# Patient Record
Sex: Male | Born: 1995 | Race: White | Hispanic: No | Smoking: Never smoker
Health system: Southern US, Community
[De-identification: ages and names within clinical notes are randomized; demographics above are authoritative.]

## PROBLEM LIST (undated history)

## (undated) DIAGNOSIS — Q600 Renal agenesis, unilateral: Secondary | ICD-10-CM

## (undated) DIAGNOSIS — K760 Fatty (change of) liver, not elsewhere classified: Secondary | ICD-10-CM

## (undated) HISTORY — DX: Fatty (change of) liver, not elsewhere classified: K76.0

## (undated) HISTORY — PX: OTHER SURGICAL HISTORY: SHX169

---
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
## 2017-07-05 ENCOUNTER — Emergency Department
Admission: EM | Admit: 2017-07-05 | Discharge: 2017-07-05 | Disposition: A | Discharge status: Home / Self Care | Payer: Managed Care, Other (non HMO) | Attending: Emergency Medicine | Admitting: Emergency Medicine

## 2017-07-05 ENCOUNTER — Encounter: Payer: Self-pay | Admitting: Emergency Medicine

## 2017-07-05 DIAGNOSIS — Y9269 Other specified industrial and construction area as the place of occurrence of the external cause: Secondary | ICD-10-CM | POA: Diagnosis not present

## 2017-07-05 DIAGNOSIS — S39011A Strain of muscle, fascia and tendon of abdomen, initial encounter: Secondary | ICD-10-CM | POA: Insufficient documentation

## 2017-07-05 DIAGNOSIS — X500XXA Overexertion from strenuous movement or load, initial encounter: Secondary | ICD-10-CM | POA: Diagnosis not present

## 2017-07-05 DIAGNOSIS — Y9389 Activity, other specified: Secondary | ICD-10-CM | POA: Insufficient documentation

## 2017-07-05 DIAGNOSIS — Y990 Civilian activity done for income or pay: Secondary | ICD-10-CM | POA: Diagnosis not present

## 2017-07-05 DIAGNOSIS — T148XXA Other injury of unspecified body region, initial encounter: Secondary | ICD-10-CM

## 2017-07-05 DIAGNOSIS — R109 Unspecified abdominal pain: Secondary | ICD-10-CM | POA: Insufficient documentation

## 2017-07-05 DIAGNOSIS — R1011 Right upper quadrant pain: Secondary | ICD-10-CM | POA: Diagnosis present

## 2017-07-05 LAB — URINALYSIS, COMPLETE (UACMP) WITH MICROSCOPIC
Bacteria, UA: NONE SEEN
Bilirubin Urine: NEGATIVE
Glucose, UA: NEGATIVE mg/dL
Hgb urine dipstick: NEGATIVE
Ketones, ur: NEGATIVE mg/dL
Leukocytes, UA: NEGATIVE
Nitrite: NEGATIVE
Protein, ur: NEGATIVE mg/dL
Specific Gravity, Urine: 1.021 (ref 1.005–1.030)
Squamous Epithelial / LPF: NONE SEEN
pH: 6 (ref 5.0–8.0)

## 2017-07-05 LAB — BASIC METABOLIC PANEL
Anion gap: 9 (ref 5–15)
BUN: 16 mg/dL (ref 6–20)
CO2: 24 mmol/L (ref 22–32)
Calcium: 9.1 mg/dL (ref 8.9–10.3)
Chloride: 106 mmol/L (ref 101–111)
Creatinine, Ser: 1.08 mg/dL (ref 0.61–1.24)
GFR calc Af Amer: >60 mL/min (ref >60)
GFR calc non Af Amer: >60 mL/min (ref >60)
Glucose, Bld: 151 mg/dL — ABNORMAL HIGH (ref 65–99)
Potassium: 3.8 mmol/L (ref 3.5–5.1)
Sodium: 139 mmol/L (ref 135–145)

## 2017-07-05 LAB — CBC
HCT: 45.2 % (ref 40.0–52.0)
Hemoglobin: 15.8 g/dL (ref 13.0–18.0)
MCH: 32.2 pg (ref 26.0–34.0)
MCHC: 34.9 g/dL (ref 32.0–36.0)
MCV: 92.2 fL (ref 80.0–100.0)
Platelets: 280 10*3/uL (ref 150–440)
RBC: 4.91 MIL/uL (ref 4.40–5.90)
RDW: 13 % (ref 11.5–14.5)
WBC: 9.3 10*3/uL (ref 3.8–10.6)

## 2017-07-05 MED ORDER — ONDANSETRON 4 MG PO TBDP
4.0000 mg | ORAL_TABLET | Freq: Once | ORAL | Status: AC
  Administered 2017-07-05: 4 mg via ORAL
  Filled 2017-07-05: qty 1

## 2017-07-05 MED ORDER — CARISOPRODOL 350 MG PO TABS: 350.0000 mg | ORAL_TABLET | Freq: Three times a day (TID) | ORAL | 0 refills | Status: DC | PRN

## 2017-07-05 MED ORDER — DICLOFENAC SODIUM 3 % TD GEL: 1.0000 "application " | Freq: Two times a day (BID) | TRANSDERMAL | 0 refills | Status: DC | PRN

## 2017-07-05 NOTE — ED Triage Notes (Signed)
Pt reports right sided flank pain x 3 or 4 days. Pt denies hx of kidney stones. Pt is ambulatory to triage in NAD.

## 2017-07-05 NOTE — ED Provider Notes (Addendum)
[facility] Emergency Department Provider Note  ____________________________________________   First MD Initiated Contact with Patient 07/05/17 0211     (approximate)  I have reviewed the triage vital signs and the nursing notes.   HISTORY  Chief Complaint Flank Pain   HPI Victor Fowler is a 21 y.o. male without any chronic medical conditions who is presenting with right upper quadrant pain radiating to his right flank over the past 2 days.  He says the pain started after lifting a heavy tire at work.  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  He says that he had to lift a large heavy tires and hold it outward for sustained period of time.  He says that ever since then he has been having pain which feels like a muscle tightness to his right upper quadrant and radiating to his right flank.  He denies any difficulty urinating.  Denies any nausea or vomiting.  Has tried ibuprofen without relief.  Denies radiation of the pain to the back to the shoulder.   History reviewed. No pertinent past medical history.  There are no active problems to display for this patient.   History reviewed. No pertinent surgical history.  Prior to Admission medications   Not on File    Allergies Patient has no known allergies.  No family history on file.  Social History Social History  Substance Use Topics  . Smoking status: Never Smoker  . Smokeless tobacco: Never Used  . Alcohol use No    Review of Systems  Constitutional: No fever/chills Eyes: No visual changes. ENT: No sore throat. Cardiovascular: Denies chest pain. Respiratory: Denies shortness of breath. Gastrointestinal:   No nausea, no vomiting.  No diarrhea.  No constipation. Genitourinary: Negative for dysuria. Musculoskeletal: Negative for back pain. Skin: Negative for rash. Neurological: Negative for headaches, focal weakness or  numbness.   ____________________________________________   PHYSICAL EXAM:  VITAL SIGNS: ED Triage Vitals  Enc Vitals Group     BP 07/05/17 0035 (!) 144/104     Pulse Rate 07/05/17 0035 100     Resp 07/05/17 0035 18     Temp 07/05/17 0035 98.2 F (36.8 C)     Temp Source 07/05/17 0035 Oral     SpO2 07/05/17 0035 99 %     Weight 07/05/17 0037 187 lb (84.8 kg)     Height 07/05/17 0037 5\' 8"  (1.727 m)     Head Circumference --      Peak Flow --      Pain Score 07/05/17 0035 7     Pain Loc --      Pain Edu? --      Excl. in GC? --     Constitutional: Alert and oriented. Well appearing and in no acute distress. Eyes: Conjunctivae are normal.  Head: Atraumatic. Nose: No congestion/rhinnorhea. Mouth/Throat: Mucous membranes are moist.  Neck: No stridor.   Cardiovascular: Normal rate, regular rhythm. Grossly normal heart sounds.   Respiratory: Normal respiratory effort.  No retractions. Lungs CTAB. Gastrointestinal: Soft with minimal right upper quadrant tenderness with a negative Murphy sign. No distention. No CVA tenderness. Musculoskeletal: No lower extremity tenderness nor edema.  No joint effusions. Neurologic:  Normal speech and language. No gross focal neurologic deficits are appreciated. Skin:  Skin is warm, dry and intact. No rash noted.  No vesicular rash in any dermatomal patterns. Psychiatric: Mood and affect are normal. Speech and behavior are normal.  ____________________________________________  LABS (all labs ordered are listed, but only abnormal results are displayed)  Labs Reviewed  URINALYSIS, COMPLETE (UACMP) WITH MICROSCOPIC - Abnormal; Notable for the following:       Result Value   Color, Urine YELLOW (*)    APPearance CLEAR (*)    All other components within normal limits  BASIC METABOLIC PANEL - Abnormal; Notable for the following:    Glucose, Bld 151 (*)    All other components within normal limits  CBC    ____________________________________________  EKG   ____________________________________________  RADIOLOGY   ____________________________________________   PROCEDURES  Procedure(s) performed:   Procedures  Critical Care performed:   ____________________________________________   INITIAL IMPRESSION / ASSESSMENT AND PLAN / ED COURSE  Pertinent labs & imaging results that were available during my care of the patient were reviewed by me and considered in my medical decision making (see chart for details).  Differential diagnosis includes, but is not limited to, biliary disease (biliary colic, acute cholecystitis, cholangitis, choledocholithiasis, etc), intrathoracic causes for epigastric abdominal pain including ACS, gastritis, duodenitis, pancreatitis, small bowel or large bowel obstruction, abdominal aortic aneurysm, hernia, and gastritis.      Patient with history and physical consistent with musculoskeletal pain.  Also very reassuring lab work.  Liver labs as well as lipase were not sent initially.  I discussed further workup with the patient including adding on liver labs as well as an ultrasound.  However, he agrees that the symptoms are mostly musculoskeletal.  He has had no difficulty with eating but such as pain after eating recently.  He is young and not obese and is otherwise not the typical demographic for gallbladder disease.  Most likely a musculoskeletal issue and also likely exacerbated by the patient continuing to work in left.  He will be given a work note.  He will be given a prescription for Voltaren gel as well as Soma.  He understands to return immediately for any worsening or concerning symptoms as we did discuss that we did not completely rule out gallbladder and liver problems. ____________________________________________   FINAL CLINICAL IMPRESSION(S) / ED DIAGNOSES  Abdominal pain.  Likely abdominal wall pain.    NEW MEDICATIONS STARTED DURING  THIS VISIT:  New Prescriptions   No medications on file     Note:  This document was prepared using Dragon voice recognition software and may include unintentional dictation errors.     Myrna Blazer, MD 07/05/17 0330    Myrna Blazer, MD 07/05/17 0330

## 2017-07-05 NOTE — ED Notes (Signed)

## 2017-07-14 ENCOUNTER — Telehealth: Payer: Self-pay | Admitting: Family Medicine

## 2017-07-14 NOTE — Telephone Encounter (Signed)
[facility] for right side pain.  I have scheduled a hospital follow up appt/MW

## 2017-07-16 NOTE — Progress Notes (Signed)
Patient: Victor ShoemakerBradley P Fowler Male    DOB: March 13, 1996   21 y.o.   MRN: [id number] Visit Date: 07/17/2017  Today's Provider: Mila Merryonald Rayven Rettig, MD   Chief Complaint  Patient presents with  . Hospitalization Follow-up   Subjective:    HPI     Follow up ER visit  Patient was seen in ER for RUQ abdominal pian and muscle strain on 07/05/2017. He was treated for RUQ abdominal pain and muscle strain. Treatment for this included; given rx for Voltaren gel as well as Soma. Advised if pain returns, he should be evaluated for gallbladder and liver problems. He reports good compliance with treatment. He reports this condition is Improved.  ----------------------------------------------------------------   Patient states symptoms have improved since he made some diet changes. Patient stated [facility]. US of abdomin was done-showing fatty liver. Patient was advised to change his diet-cut back on fried foods. He states that pain has mostly resolved since making dietary changes.   No Known Allergies   Current Outpatient Medications:  .  carisoprodol (SOMA) 350 MG tablet, Take 1 tablet (350 mg total) by mouth 3 (three) times daily as needed. (Patient not taking: Reported on 07/17/2017), Disp: 15 tablet, Rfl: 0 .  Diclofenac Sodium 3 % GEL, Place 1 application onto the skin every 12 (twelve) hours as needed. (Patient not taking: Reported on 07/17/2017), Disp: 50 g, Rfl: 0  Review of Systems  Constitutional: Negative for appetite change, chills and fever.  Respiratory: Negative for chest tightness, shortness of breath and wheezing.   Cardiovascular: Negative for chest pain and palpitations.  Gastrointestinal: Negative for abdominal pain, nausea and vomiting.    Social History   Tobacco Use  . Smoking status: Never Smoker  . Smokeless tobacco: Never Used  Substance Use Topics  . Alcohol use: No   Objective:   BP 138/84 (BP Location:  Right Arm, Patient Position: Sitting, Cuff Size: Large)   Pulse 99   Temp 98.7 F (37.1 C) (Oral)   Resp 16   Ht 5\' 8"  (1.727 m)   Wt 181 lb (82.1 kg)   SpO2 98%   BMI 27.52 kg/m  Vitals:   07/17/17 1126  BP: 138/84  Pulse: 99  Resp: 16  Temp: 98.7 F (37.1 C)  TempSrc: Oral  SpO2: 98%  Weight: 181 lb (82.1 kg)  Height: 5\' 8"  (1.727 m)   Dep[facility] 07/17/2017  Decreased Interest 0  Down, Depressed, Hopeless 0  PHQ - 2 Score 0  Altered sleeping 3  Tired, decreased energy 0  Change in appetite 0  Feeling bad or failure about yourself  0  Trouble concentrating 0  Moving slowly or fidgety/restless 0  Suicidal thoughts 0  PHQ-9 Score 3  Difficult doing work/chores Not difficult at all     Physical Exam  General Appearance:    Alert, cooperative, no distress  Eyes:    PERRL, conjunctiva/corneas clear, EOM's intact       Lungs:     Clear to auscultation bilaterally, respirations unlabored  Heart:    Regular rate and rhythm  Abdomen:   bowel sounds present and normal in all 4 quadrants, soft, round, nontender or nondistended. No CVA tenderness        Assessment & Plan:     1. Fatty liver Has started low fat diet and symptoms resolved. Will [facility].  - Hepatitis  A vaccine adult IM  2. Need for influenza vaccination  - Flu Vaccine QUAD 36+ mos IM       Mila Merryonald Alex Mcmanigal, MD  [facility]

## 2017-07-17 ENCOUNTER — Encounter: Payer: Self-pay | Admitting: Family Medicine

## 2017-07-17 ENCOUNTER — Ambulatory Visit: Payer: Managed Care, Other (non HMO) | Admitting: Family Medicine

## 2017-07-17 VITALS — BP 138/84 | HR 99 | Temp 98.7°F | Resp 16 | Ht 68.0 in | Wt 181.0 lb

## 2017-07-17 DIAGNOSIS — K760 Fatty (change of) liver, not elsewhere classified: Secondary | ICD-10-CM | POA: Diagnosis not present

## 2017-07-17 DIAGNOSIS — Z23 Encounter for immunization: Secondary | ICD-10-CM | POA: Diagnosis not present

## 2017-07-22 DIAGNOSIS — K760 Fatty (change of) liver, not elsewhere classified: Secondary | ICD-10-CM | POA: Insufficient documentation

## 2017-07-29 ENCOUNTER — Encounter: Payer: Self-pay | Admitting: Family Medicine

## 2017-07-29 ENCOUNTER — Ambulatory Visit: Payer: Managed Care, Other (non HMO) | Admitting: Family Medicine

## 2017-07-29 VITALS — BP 110/70 | HR 87 | Temp 98.3°F | Resp 16 | Wt 181.0 lb

## 2017-07-29 DIAGNOSIS — R42 Dizziness and giddiness: Secondary | ICD-10-CM

## 2017-07-29 DIAGNOSIS — R5383 Other fatigue: Secondary | ICD-10-CM

## 2017-07-29 DIAGNOSIS — R739 Hyperglycemia, unspecified: Secondary | ICD-10-CM | POA: Diagnosis not present

## 2017-07-29 NOTE — Patient Instructions (Signed)
Hypoglycemia  Hypoglycemia occurs when the level of sugar (glucose) in the blood is too low. Glucose is a type of sugar that provides the body's main source of energy. Certain hormones (insulin and glucagon) control the level of glucose in the blood. Insulin lowers blood glucose, and glucagon increases blood glucose. Hypoglycemia can result from having too much insulin in the bloodstream, or from not eating enough food that contains glucose.  Hypoglycemia can happen in people who do or do not have diabetes. It can develop quickly, and it can be a medical emergency.  What are the causes?  Hypoglycemia occurs most often in people who have diabetes. If you have diabetes, hypoglycemia may be caused by:   Diabetes medicine.   Not eating enough, or not eating often enough.   Increased physical activity.   Drinking alcohol, especially when you have not eaten recently.    If you do not have diabetes, hypoglycemia may be caused by:   A tumor in the pancreas. The pancreas is the organ that makes insulin.   Not eating enough, or not eating for long periods at a time (fasting).   Severe infection or illness that affects the liver, heart, or kidneys.   Certain medicines.    You may also have reactive hypoglycemia. This condition causes hypoglycemia within 4 hours of eating a meal. This may occur after having stomach surgery. Sometimes, the cause of reactive hypoglycemia is not known.  What increases the risk?  Hypoglycemia is more likely to develop in:   People who have diabetes and take medicines to lower blood glucose.   People who abuse alcohol.   People who have a severe illness.    What are the signs or symptoms?  Hypoglycemia may not cause any symptoms. If you have symptoms, they may include:   Hunger.   Anxiety.   Sweating and feeling clammy.   Confusion.   Dizziness or feeling light-headed.   Sleepiness.   Nausea.   Increased heart rate.   Headache.   Blurry  vision.   Seizure.   Nightmares.   Tingling or numbness around the mouth, lips, or tongue.   A change in speech.   Decreased ability to concentrate.   A change in coordination.   Restless sleep.   Tremors or shakes.   Fainting.   Irritability.    How is this diagnosed?  Hypoglycemia is diagnosed with a blood test to measure your blood glucose level. This blood test is done while you are having symptoms. Your health care provider may also do a physical exam and review your medical history.  If you do not have diabetes, other tests may be done to find the cause of your hypoglycemia.  How is this treated?  This condition can often be treated by immediately eating or drinking something that contains glucose, such as:   3-4 sugar tablets (glucose pills).   Glucose gel, 15-gram tube.   Fruit juice, 4 oz (120 mL).   Regular soda (not diet soda), 4 oz (120 mL).   Low-fat milk, 4 oz (120 mL).   Several pieces of hard candy.   Sugar or honey, 1 Tbsp.    Treating Hypoglycemia If You Have Diabetes    If you are alert and able to swallow safely, follow the 15:15 rule:   Take 15 grams of a rapid-acting carbohydrate. Rapid-acting options include:  ? 1 tube of glucose gel.  ? 3 glucose pills.  ? 6-8 pieces of hard candy.  ?   4 oz (120 mL) of fruit juice.  ? 4 oz (120 ml) of regular (not diet) soda.   Check your blood glucose 15 minutes after you take the carbohydrate.   If the repeat blood glucose level is still at or below 70 mg/dL (3.9 mmol/L), take 15 grams of a carbohydrate again.   If your blood glucose level does not increase above 70 mg/dL (3.9 mmol/L) after 3 tries, seek emergency medical care.   After your blood glucose level returns to normal, eat a meal or a snack within 1 hour.    Treating Severe Hypoglycemia  Severe hypoglycemia is when your blood glucose level is at or below 54 mg/dL (3 mmol/L). Severe hypoglycemia is an emergency. Do not wait to see if the symptoms will go away. Get medical help  right away. Call your local emergency services (911 in the [place] Do not drive yourself to the hospital.  If you have severe hypoglycemia and you cannot eat or drink, you may need an injection of glucagon. A family member or close friend should learn how to check your blood glucose and how to give you a glucagon injection. Ask your health care provider if you need to have an emergency glucagon injection kit available.  Severe hypoglycemia may need to be treated in a hospital. The treatment may include getting glucose through an IV tube. You may also need treatment for the cause of your hypoglycemia.  Follow these instructions at home:  General instructions   Avoid any diets that cause you to not eat enough food. Talk with your health care provider before you start any new diet.   Take over-the-counter and prescription medicines only as told by your health care provider.   Limit alcohol intake to no more than 1 drink per day for nonpregnant women and 2 drinks per day for men. One drink equals 12 oz of beer, 5 oz of wine, or 1 oz of hard liquor.   Keep all follow-up visits as told by your health care provider. This is important.  If You Have Diabetes:     Make sure you know the symptoms of hypoglycemia.   Always have a rapid-acting carbohydrate snack with you to treat low blood sugar.   Follow your diabetes management plan, as told by your health care provider. Make sure you:  ? Take your medicines as directed.  ? Follow your exercise plan.  ? Follow your meal plan. Eat on time, and do not skip meals.  ? Check your blood glucose as often as directed. Make sure to check your blood glucose before and after exercise. If you exercise longer or in a different way than usual, check your blood glucose more often.  ? Follow your sick day plan whenever you cannot eat or drink normally. Make this plan in advance with your health care provider.   Share your diabetes management plan with people in your workplace, school,  and household.   Check your urine for ketones when you are ill and as told by your health care provider.   Carry a medical alert card or wear medical alert jewelry.  If You Have Reactive Hypoglycemia or Low Blood Sugar From Other Causes:   Monitor your blood glucose as told by your health care provider.   Follow instructions from your health care provider about eating or drinking restrictions.  Contact a health care provider if:   You have problems keeping your blood glucose in your target range.   You have   frequent episodes of hypoglycemia.  Get help right away if:   You continue to have hypoglycemia symptoms after eating or drinking something containing glucose.   Your blood glucose is at or below 54 mg/dL (3 mmol/L).   You have a seizure.   You faint.  These symptoms may represent a serious problem that is an emergency. Do not wait to see if the symptoms will go away. Get medical help right away. Call your local emergency services (911 in the [place] Do not drive yourself to the hospital.  This information is not intended to replace advice given to you by your health care provider. Make sure you discuss any questions you have with your health care provider.  Document Released: 08/26/2005 Document Revised: 02/07/2016 Document Reviewed: 09/29/2015  Elsevier Interactive Patient Education  2018 [employer]

## 2017-07-29 NOTE — Progress Notes (Signed)
Patient: Victor Fowler Male    DOB: [birth year]   21 y.o.   MRN: [id number] Visit Date: 07/29/2017  Today's Provider: Mila Merryonald Montez Stryker, MD   Chief Complaint  Patient presents with  . Dizziness  . Nausea   Subjective:    Patient states for the last week he has been having dizziness and weakness everyday right before lunch. Patient also states he has been feeling very fatigued lately. Patient states that he has been eating a piece of candy when he starts to feel the dizziness and he sometimes feel better. Patient states that at today he has also been feeling nauseous.    Dizziness  This is a new problem. The current episode started in the past 7 days. The problem occurs daily. The problem has been unchanged. Associated symptoms include fatigue, nausea and weakness. Pertinent negatives include no abdominal pain, anorexia, arthralgias, change in bowel habit, chest pain, chills, congestion, coughing, diaphoresis, fever, headaches, joint swelling, myalgias, neck pain, numbness, rash, sore throat, swollen glands, urinary symptoms, vertigo, visual change or vomiting. Nothing aggravates the symptoms.   He is noted to have had mildly elevated blood sugar at recent ER visits.  Glucose  Date Value Ref Range Status  11/26/2012 122 (H) 65 - 99 mg/dL Final   Glucose, Bld  Date Value Ref Range Status  07/05/2017 151 (H) 65 - 99 mg/dL Final       No Known Allergies  No current outpatient medications on file.  Review of Systems  Constitutional: Positive for fatigue. Negative for appetite change, chills, diaphoresis and fever.  HENT: Negative for congestion and sore throat.   Respiratory: Negative for cough, chest tightness, shortness of breath and wheezing.   Cardiovascular: Negative for chest pain and palpitations.  Gastrointestinal: Positive for nausea. Negative for abdominal pain, anorexia, change in bowel habit and vomiting.  Musculoskeletal: Negative for arthralgias, joint  swelling, myalgias and neck pain.  Skin: Negative for rash.  Neurological: Positive for dizziness, weakness and light-headedness. Negative for vertigo, numbness and headaches.    Social History   Tobacco Use  . Smoking status: Never Smoker  . Smokeless tobacco: Never Used  Substance Use Topics  . Alcohol use: No   Objective:   BP 110/70 (BP Location: Right Arm, Patient Position: Sitting, Cuff Size: Large)   Pulse 87   Temp 98.3 F (36.8 C) (Oral)   Resp 16   Wt 181 lb (82.1 kg)   SpO2 97%   BMI 27.52 kg/m  Vitals:   07/29/17 1035  BP: 110/70  Pulse: 87  Resp: 16  Temp: 98.3 F (36.8 C)  TempSrc: Oral  SpO2: 97%  Weight: 181 lb (82.1 kg)     Physical Exam   General Appearance:    Alert, cooperative, no distress  Eyes:    PERRL, conjunctiva/corneas clear, EOM's intact       Lungs:     Clear to auscultation bilaterally, respirations unlabored  Heart:    Regular rate and rhythm  Neurologic:   Awake, alert, oriented x 3. No apparent focal neurological           defect.           Assessment & Plan:     1. Other fatigue  - Hemoglobin A1c - COMPLETE METABOLIC PANEL WITH GFR - TSH  2. Dizziness  - Hemoglobin A1c - COMPLETE METABOLIC PANEL WITH GFR - TSH  3. Hyperglycemia  - Hemoglobin A1c - COMPLETE METABOLIC PANEL WITH  GFR - TSH  Symptoms suspicious for hypoglycemic episodes, he reports family history of diabetes which needs to be ruled out. Mila Merry.       Ludivina Guymon, MD  [facility]

## 2017-07-30 ENCOUNTER — Ambulatory Visit: Payer: Managed Care, Other (non HMO) | Admitting: Family Medicine

## 2017-07-30 LAB — COMPLETE METABOLIC PANEL WITH GFR
AG Ratio: 1.9 (calc) (ref 1.0–2.5)
ALT: 25 U/L (ref 9–46)
AST: 18 U/L (ref 10–40)
Albumin: 4.8 g/dL (ref 3.6–5.1)
Alkaline phosphatase (APISO): 82 U/L (ref 40–115)
BUN: 11 mg/dL (ref 7–25)
CO2: 25 mmol/L (ref 20–32)
Calcium: 10.1 mg/dL (ref 8.6–10.3)
Chloride: 103 mmol/L (ref 98–110)
Creat: 0.9 mg/dL (ref 0.60–1.35)
GFR, Est African American: 140 mL/min/{1.73_m2} (ref >=60)
GFR, Est Non African American: 121 mL/min/{1.73_m2} (ref >=60)
Globulin: 2.5 g/dL (calc) (ref 1.9–3.7)
Glucose, Bld: 101 mg/dL — ABNORMAL HIGH (ref 65–99)
Potassium: 4.2 mmol/L (ref 3.5–5.3)
Sodium: 137 mmol/L (ref 135–146)
Total Bilirubin: 0.6 mg/dL (ref 0.2–1.2)
Total Protein: 7.3 g/dL (ref 6.1–8.1)

## 2017-07-30 LAB — HEMOGLOBIN A1C
Hgb A1c MFr Bld: 5.3 % of total Hgb (ref <5.7)
Mean Plasma Glucose: 105 (calc)
eAG (mmol/L): 5.8 (calc)

## 2017-07-30 LAB — TSH: TSH: 1.62 mIU/L (ref 0.40–4.50)

## 2017-08-05 ENCOUNTER — Encounter: Payer: Self-pay | Admitting: Family Medicine

## 2017-08-05 ENCOUNTER — Ambulatory Visit: Payer: Managed Care, Other (non HMO) | Admitting: Family Medicine

## 2017-08-05 VITALS — BP 110/54 | HR 101 | Temp 98.2°F | Resp 16 | Wt 181.0 lb

## 2017-08-05 DIAGNOSIS — R112 Nausea with vomiting, unspecified: Secondary | ICD-10-CM

## 2017-08-05 DIAGNOSIS — K529 Noninfective gastroenteritis and colitis, unspecified: Secondary | ICD-10-CM

## 2017-08-05 DIAGNOSIS — R109 Unspecified abdominal pain: Secondary | ICD-10-CM | POA: Diagnosis not present

## 2017-08-05 MED ORDER — PROMETHAZINE HCL 25 MG PO TABS: 25.0000 mg | ORAL_TABLET | Freq: Three times a day (TID) | ORAL | 0 refills | Status: DC | PRN

## 2017-08-05 NOTE — Patient Instructions (Addendum)
Please go to the [facility] draw center in [address] on the second floor of [facility]    Viral Gastroenteritis, Adult Viral gastroenteritis is also known as the stomach flu. This condition is caused by various viruses. These viruses can be passed from person to person very easily (are very contagious). This condition may affect your stomach, small intestine, and large intestine. It can cause sudden watery diarrhea, fever, and vomiting. Diarrhea and vomiting can make you feel weak and cause you to become dehydrated. You may not be able to keep fluids down. Dehydration can make you tired and thirsty, cause you to have a dry mouth, and decrease how often you urinate. Older adults and people with other diseases or a weak immune system are at higher risk for dehydration. It is important to replace the fluids that you lose from diarrhea and vomiting. If you become severely dehydrated, you may need to get fluids through an IV tube. What are the causes? Gastroenteritis is caused by various viruses, including rotavirus and norovirus. Norovirus is the most common cause in adults. You can get sick by eating food, drinking water, or touching a surface contaminated with one of these viruses. You can also get sick from sharing utensils or other personal items with an infected person. What increases the risk? This condition is more likely to develop in people:  Who have a weak defense system (immune system).  Who live with one or more children who are younger than 626 years old.  Who live in a nursing home.  Who go on cruise ships.  What are the signs or symptoms? Symptoms of this condition start suddenly 1-2 days after exposure to a virus. Symptoms may last a few days or as long as a week. The most common symptoms are watery diarrhea and vomiting. Other symptoms include:  Fever.  Headache.  Fatigue.  Pain in the abdomen.  Chills.  Weakness.  Nausea.  Muscle aches.  Loss of  appetite.  How is this diagnosed? This condition is diagnosed with a medical history and physical exam. You may also have a stool test to check for viruses or other infections. How is this treated? This condition typically goes away on its own. The focus of treatment is to restore lost fluids (rehydration). Your health care provider may recommend that you take an oral rehydration solution (ORS) to replace important salts and minerals (electrolytes) in your body. Severe cases of this condition may require giving fluids through an IV tube. Treatment may also include medicine to help with your symptoms. Follow these instructions at home: Follow instructions from your health care provider about how to care for yourself at home. Eating and drinking Follow these recommendations as told by your health care provider:  Take an ORS. This is a drink that is sold at pharmacies and retail stores.  Drink clear fluids in small amounts as you are able. Clear fluids include water, ice chips, diluted fruit juice, and low-calorie sports drinks.  Eat bland, easy-to-digest foods in small amounts as you are able. These foods include bananas, applesauce, rice, lean meats, toast, and crackers.  Avoid fluids that contain a lot of sugar or caffeine, such as energy drinks, sports drinks, and soda.  Avoid alcohol.  Avoid spicy or fatty foods.  General instructions   Drink enough fluid to keep your urine clear or pale yellow.  Wash your hands often. If soap and water are not available, use hand sanitizer.  Make sure that  all people in your household wash their hands well and often.  Take over-the-counter and prescription medicines only as told by your health care provider.  Rest at home while you recover.  Watch your condition for any changes.  Take a warm bath to relieve any burning or pain from frequent diarrhea episodes.  Keep all follow-up visits as told by your health care provider. This is  important. Contact a health care provider if:  You cannot keep fluids down.  Your symptoms get worse.  You have new symptoms.  You feel light-headed or dizzy.  You have muscle cramps. Get help right away if:  You have chest pain.  You feel extremely weak or you faint.  You see blood in your vomit.  Your vomit looks like coffee grounds.  You have bloody or black stools or stools that look like tar.  You have a severe headache, a stiff neck, or both.  You have a rash.  You have severe pain, cramping, or bloating in your abdomen.  You have trouble breathing or you are breathing very quickly.  Your heart is beating very quickly.  Your skin feels cold and clammy.  You feel confused.  You have pain when you urinate.  You have signs of dehydration, such as: ? Dark urine, very little urine, or no urine. ? Cracked lips. ? Dry mouth. ? Sunken eyes. ? Sleepiness. ? Weakness. This information is not intended to replace advice given to you by your health care provider. Make sure you discuss any questions you have with your health care provider. Document Released: 08/26/2005 Document Revised: 02/07/2016 Document Reviewed: 05/02/2015 Elsevier Interactive Patient Education  2017 [employer]

## 2017-08-05 NOTE — Progress Notes (Signed)
Patient: Victor Fowler Male    DOB: Dec 01, 1995   21 y.o.   MRN: [id number] Visit Date: 08/05/2017  Today's Provider: Mila Merryonald Masayoshi Couzens, MD   Chief Complaint  Patient presents with  . Hospitalization Follow-up   Subjective:    HPI   Follow up ER visit  Patient was seen in ER for abdominal pain on 08/04/2017. He was treated for GERD. Treatment for this included; patient was given GI cocktail in ER. Patient was discharged home with rx for Famotidine 20 mg qd, for GERD. He reports good compliance with treatment. He reports this condition is Unchanged.  ----------------------------------------------------------------   He was previously seen with intermittent RUQ with work up remarkable only for fatty liver on ultrasound. He had been doing better with low fat diet until 2 days ago when he starting having RUQ pain followed by nausea, vomiting, and diarrhea yesterday which prompted ER visit.  Labs were only remarkable for elevated WBC. Symptoms apparently resolved with GI cocktail. He felt a little better last night, but still having pain, nausea, and diarrhea this morning he has not yet take famotidine. Today.    No Known Allergies   Current Outpatient Medications:  .  famotidine (PEPCID) 20 MG tablet, Take 20 mg by mouth., Disp: , Rfl:   Review of Systems  Constitutional: Negative for appetite change, chills and fever.  Respiratory: Negative for chest tightness, shortness of breath and wheezing.   Cardiovascular: Negative for chest pain and palpitations.  Gastrointestinal: Positive for abdominal pain, nausea and vomiting. Negative for diarrhea.    Social History   Tobacco Use  . Smoking status: Never Smoker  . Smokeless tobacco: Never Used  Substance Use Topics  . Alcohol use: No   Objective:   BP (!) 110/54 (BP Location: Right Arm, Patient Position: Sitting, Cuff Size: Large)   Pulse (!) 101   Temp 98.2 F (36.8 C) (Oral)   Resp 16   Wt 181 lb (82.1 kg)    SpO2 99%   BMI 27.52 kg/m  Vitals:   08/05/17 1114  BP: (!) 110/54  Pulse: (!) 101  Resp: 16  Temp: 98.2 F (36.8 C)  TempSrc: Oral  SpO2: 99%  Weight: 181 lb (82.1 kg)     Physical Exam  General Appearance:    Alert, cooperative, no distress  Eyes:    PERRL, conjunctiva/corneas clear, EOM's intact       Lungs:     Clear to auscultation bilaterally, respirations unlabored  Heart:    Regular rate and rhythm  Abdomen:   Mild diffuse abdominal tenderness, no rebound or guarding, no masses, bowel sounds hypoactive throughout.         Assessment & Plan:     1. Gastroenteritis Counseled on bland diet, increasing clear liquids, expect improvement over the next few days.   2. Nausea and vomiting, intractability of vomiting not specified, unspecified vomiting type  - promethazine (PHENERGAN) 25 MG tablet; Take 1 tablet (25 mg total) by mouth every 8 (eight) hours as needed for nausea or vomiting.  Dispense: 20 tablet; Refill: 0  3. Abdominal pain, unspecified abdominal location Unclear if this is exacerbation of pain he has been having over the last several weeks, or part of acute gastroenteritis. He did improve with GI cocktail given in ER. Will proceed with GI workup.  - H. pylori breath test - DG UGI W/SMALL BOWEL; Future       Mila Merryonald Consuella Scurlock, MD  [facility]  [facility]  

## 2017-08-12 ENCOUNTER — Ambulatory Visit
Admission: RE | Admit: 2017-08-12 | Discharge: 2017-08-12 | Disposition: A | Discharge status: Home / Self Care | Payer: Managed Care, Other (non HMO) | Source: Ambulatory Visit | Attending: Family Medicine | Admitting: Family Medicine

## 2017-08-12 ENCOUNTER — Telehealth: Payer: Self-pay

## 2017-08-12 DIAGNOSIS — R935 Abnormal findings on diagnostic imaging of other abdominal regions, including retroperitoneum: Secondary | ICD-10-CM | POA: Insufficient documentation

## 2017-08-12 DIAGNOSIS — R109 Unspecified abdominal pain: Secondary | ICD-10-CM | POA: Insufficient documentation

## 2017-08-12 LAB — H. PYLORI BREATH TEST: H. pylori Breath Test: NOT DETECTED

## 2017-08-12 IMAGING — RF DG UGI W/ SMALL BOWEL
2 series · 12 of 12 positions shown · non-contrast
Comparison: None available in PACs

CLINICAL DATA: Right-sided abdominal pain for the past month.

EXAM:
UPPER GI SERIES WITH SMALL BOWEL FOLLOW-THROUGH using barium
FLUOROSCOPY TIME:  Fluoroscopy Time:  1 minutes, 30 seconds
Radiation Exposure Index (if provided by the fluoroscopic device):
[CL] micro Gy per meters square
Number of Acquired Spot Images: 13
TECHNIQUE: Combined double contrast and single contrast upper GI series using
effervescent crystals, thick barium, and thin barium. Subsequently,
serial images of the small bowel were obtained including spot views
of the terminal ileum.
After obtaining a scout radiograph a routine upper GI series was
performed using thin and high density barium. Effervescent crystals
and a barium tablet were administered

[Series 1: t abdomen supine · 0.14mm/px · 2 of 2 slices shown]
[im 1/2]
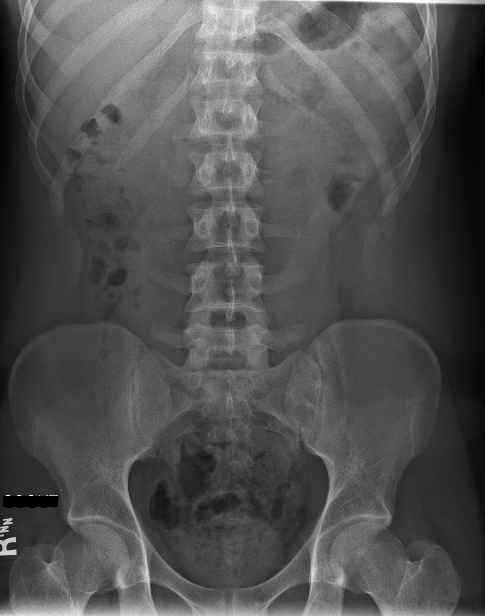
[im 2/2]
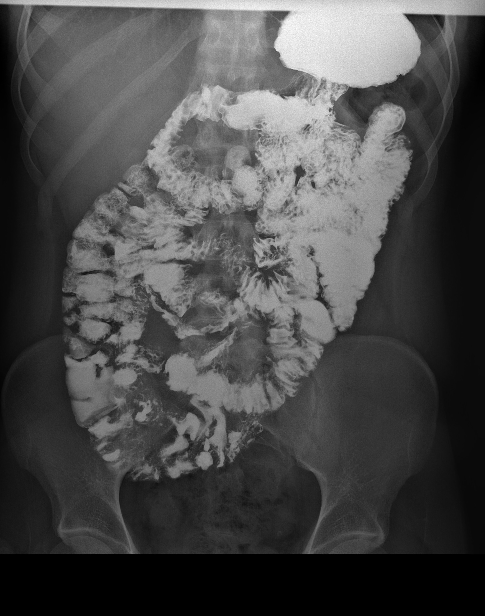

[Series 2: fluoro_barium 2fps_bw · 0.17mm/px · 10 of 10 slices shown]
[im 1/10]
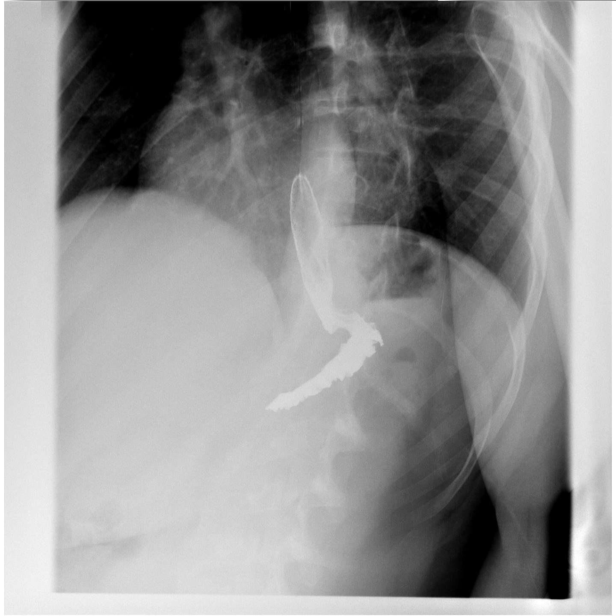
[im 2/10]
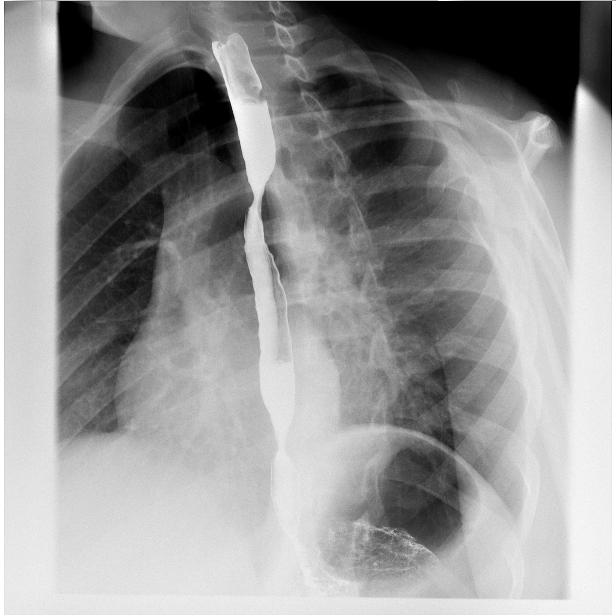
[im 3/10]
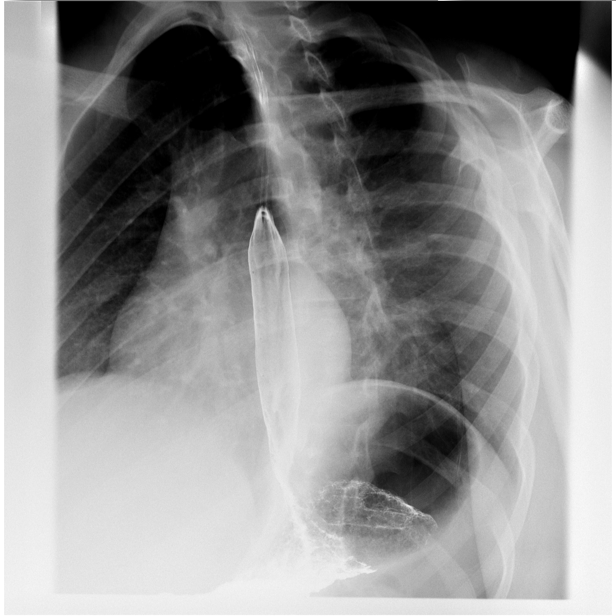
[im 4/10]
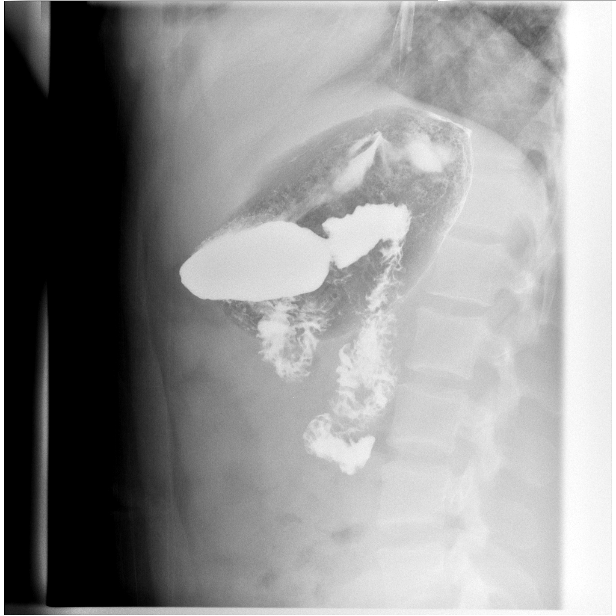
[im 5/10]
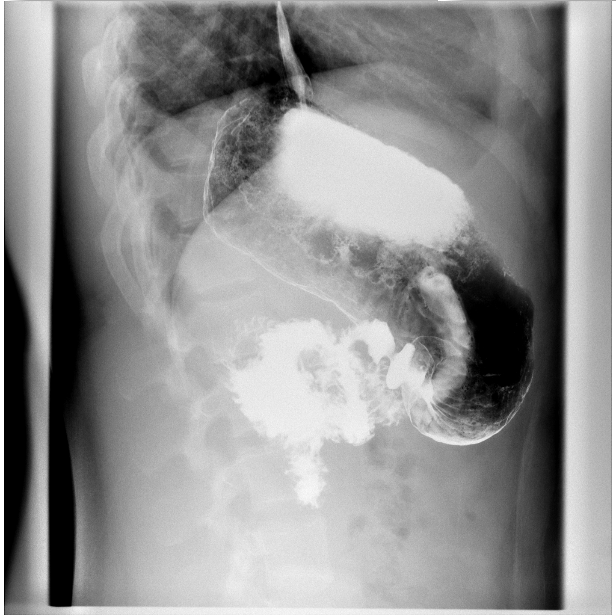
[im 6/10]
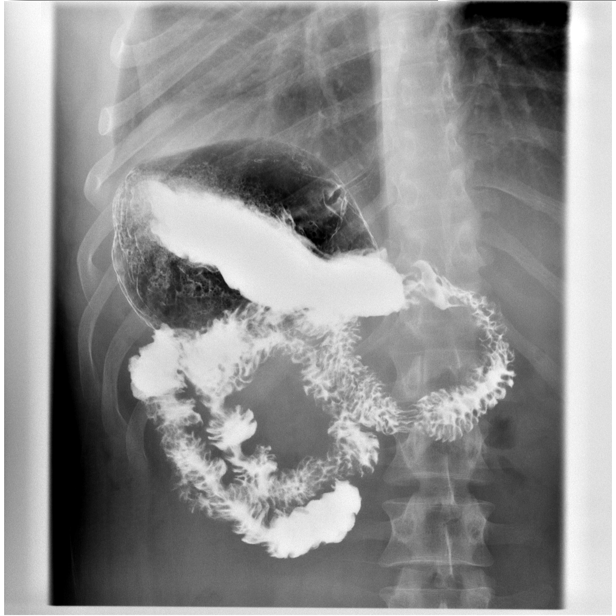
[im 7/10]
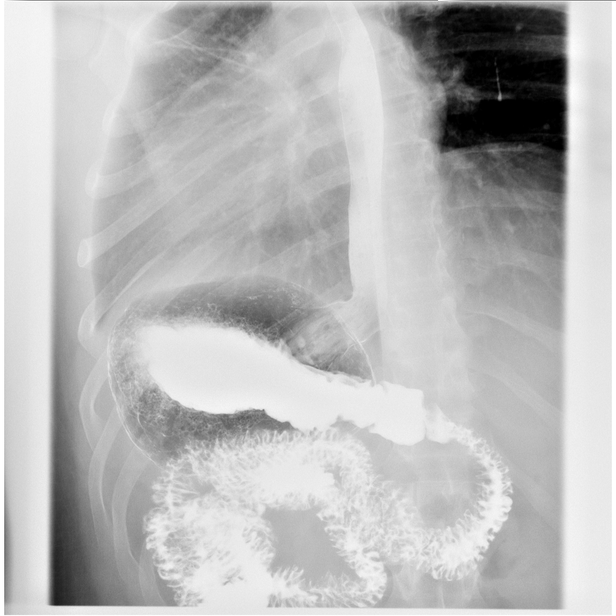
[im 8/10]
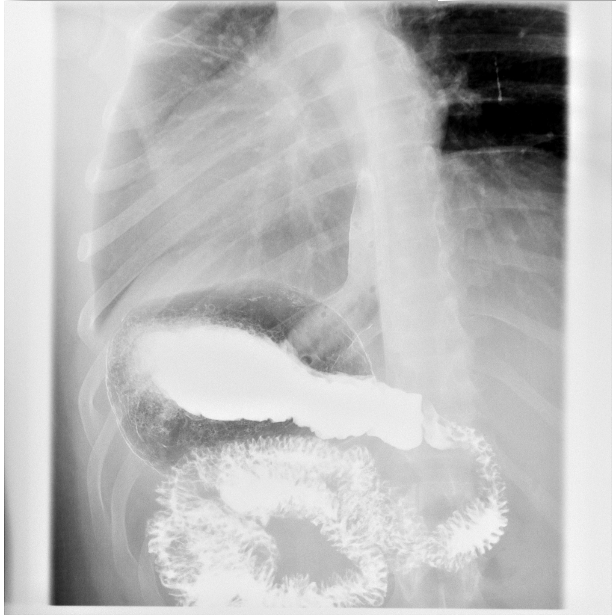
[im 9/10]
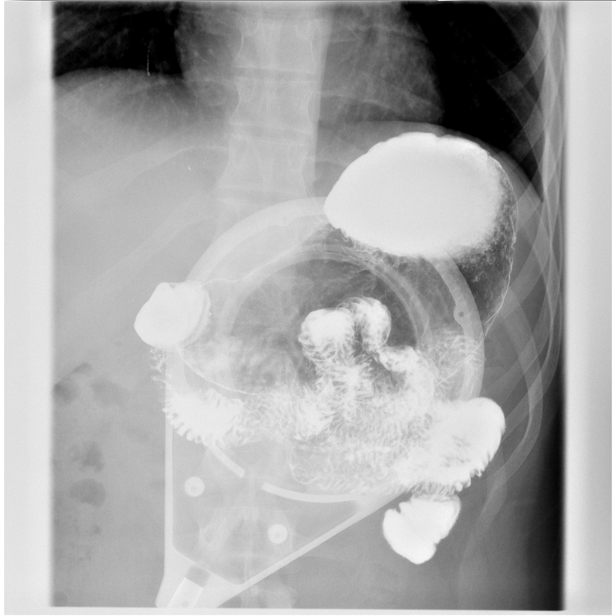
[im 10/10]
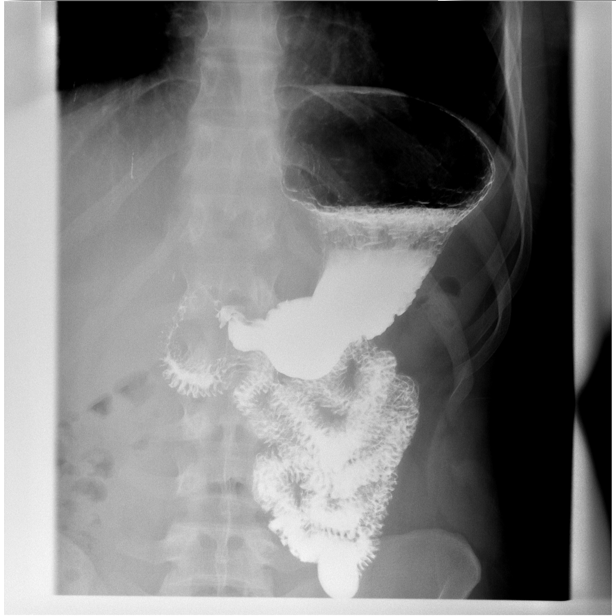

[12 of 12 positions shown; findings below may reference images not displayed]

FINDINGS: The scout film reveals a normal bowel gas pattern. There are no
abnormal soft tissue calcifications.

The patient ingested thick and thin barium and gas forming crystals
without difficulty. The thoracic esophagus was normal in a survey
fashion. The barium tablet passed promptly from the mouth to the
stomach. The stomach distended well. The mucosal fold pattern was
normal. No ulcer niche was observed. Gastric emptying was prompt.
The duodenal bulb and C sweep were normal. No evidence of a duodenal
ulcer or duodenal inflammation.

Over the course of approximately 20 minutes the barium passed from
the stomach to the right colon. The jejunum demonstrated a normal
feathery mucosal pattern. Within the ileum there was some
flocculation of the barium with some loss of normally expected
mucosal fold pattern. There was tenderness in the right lower
quadrant over the terminal ileum. Peristalsis through the terminal
ileum appeared normal however. There was some separation of distal
small bowel loops from one-another in the pelvis and right lower
quadrant.
IMPRESSION: Normal appearance of the stomach, duodenum, and jejunum.

Some loss of the normal mucosal pattern of the jejunum with
separation of bowel loops may reflect inflammatory change tenderness
to palpation over this region was observed. Correlation with the
outside abdominal CT scan performed recently would be useful. The
findings could reflect inflammatory bowel disease.

## 2017-08-12 NOTE — Telephone Encounter (Signed)
Advised wife as below. She was wanting to know if there is anything that the patient could take OTC that could help calm down the inflammation in his stomach. She is wanting him to be completley well by next week due to him starting a new job. Please advise. Thanks!

## 2017-08-12 NOTE — Telephone Encounter (Signed)
-----   Message from Malva Limesonald E Fisher, MD sent at 08/12/2017  2:11 PM EST ----- Upper GI also shows some signs of inflammation, possible from recent stomach virus. Should resolve on its on.

## 2017-08-13 NOTE — Telephone Encounter (Signed)
Left message for wife as below.

## 2017-08-13 NOTE — Telephone Encounter (Signed)
No. Can try peptol-bismol, if he still has upset stomach. If he's not improving then he needs referral to GI

## 2017-08-21 ENCOUNTER — Ambulatory Visit
Admission: RE | Admit: 2017-08-21 | Discharge: 2017-08-21 | Disposition: A | Discharge status: Home / Self Care | Payer: Managed Care, Other (non HMO) | Source: Ambulatory Visit | Attending: Family Medicine | Admitting: Family Medicine

## 2017-08-21 ENCOUNTER — Ambulatory Visit: Payer: Managed Care, Other (non HMO) | Admitting: Family Medicine

## 2017-08-21 ENCOUNTER — Encounter: Payer: Self-pay | Admitting: Family Medicine

## 2017-08-21 VITALS — BP 122/82 | HR 94 | Temp 97.6°F | Resp 16 | Wt 185.0 lb

## 2017-08-21 DIAGNOSIS — R112 Nausea with vomiting, unspecified: Secondary | ICD-10-CM

## 2017-08-21 DIAGNOSIS — R109 Unspecified abdominal pain: Secondary | ICD-10-CM | POA: Diagnosis not present

## 2017-08-21 DIAGNOSIS — M542 Cervicalgia: Secondary | ICD-10-CM

## 2017-08-21 IMAGING — CR DG CERVICAL SPINE COMPLETE 4+V
1 series · 6 of 6 positions shown · non-contrast
Comparison: None.

CLINICAL DATA: Pain and stiffness.  No injury.

EXAM:
CERVICAL SPINE - COMPLETE 4+ VIEW

[Series 1: dg cervical spine complete · 0.14mm/px · 6 of 6 slices shown]
[im 1/6]
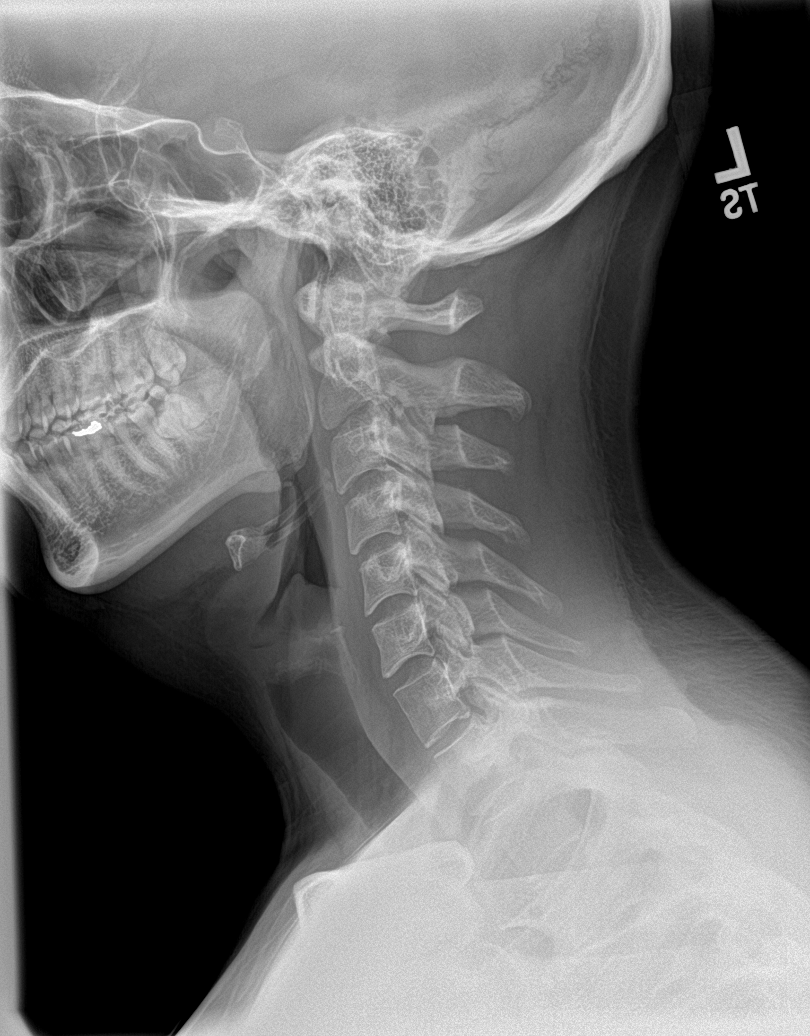
[im 2/6]
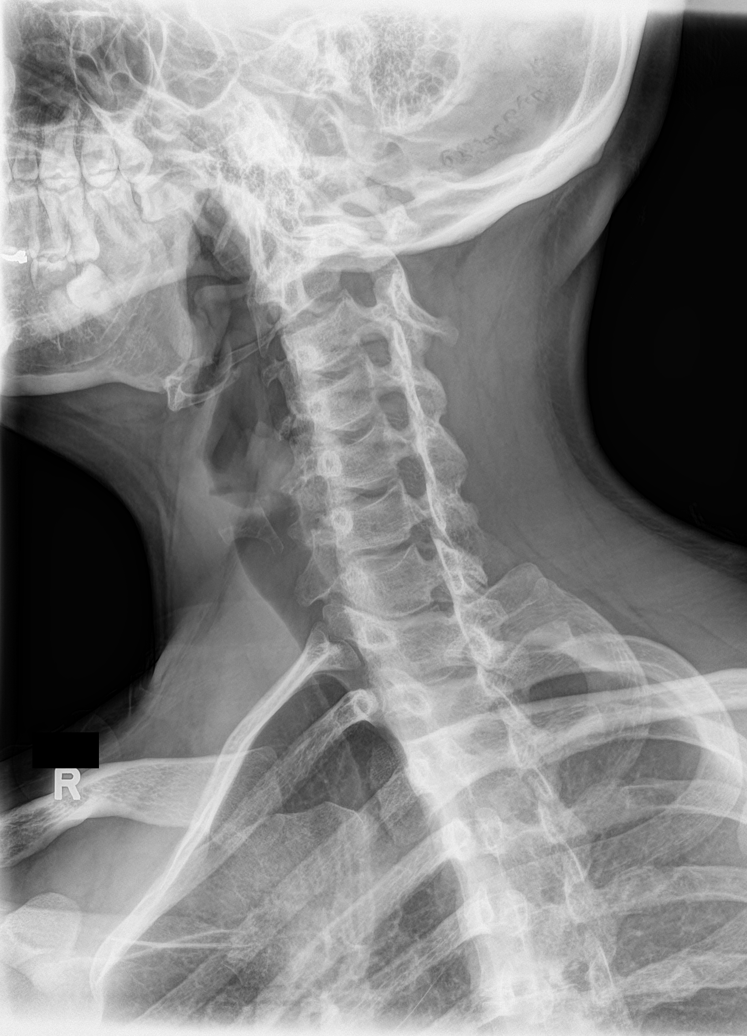
[im 3/6]
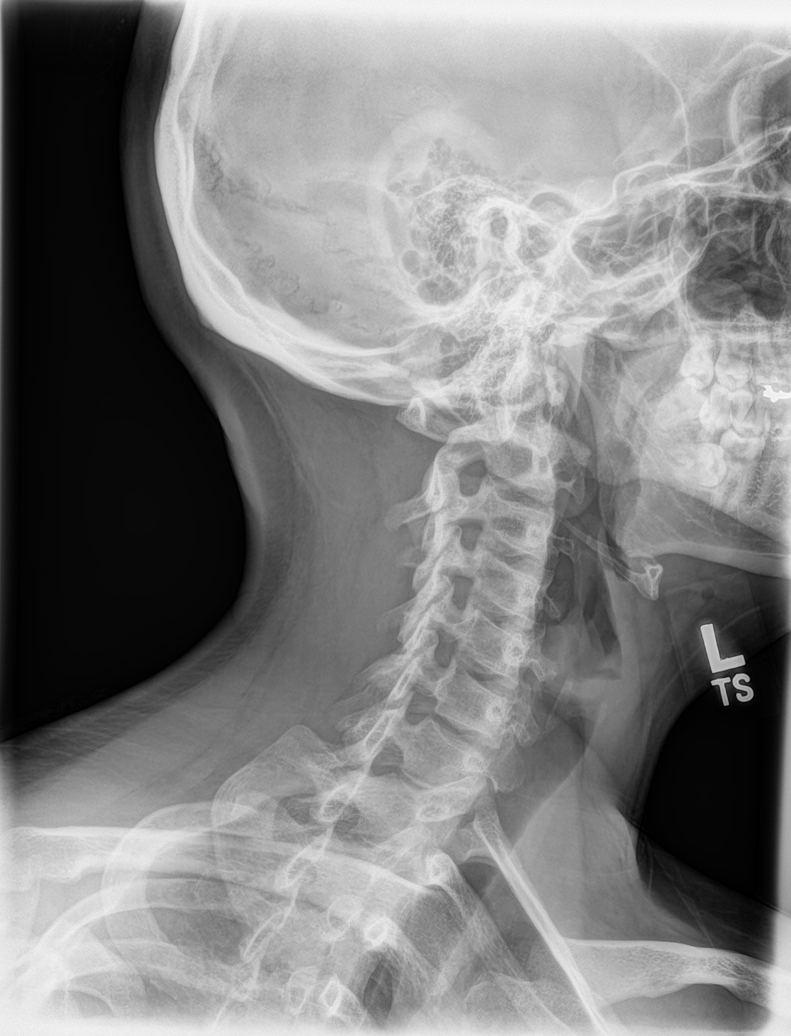
[im 4/6]
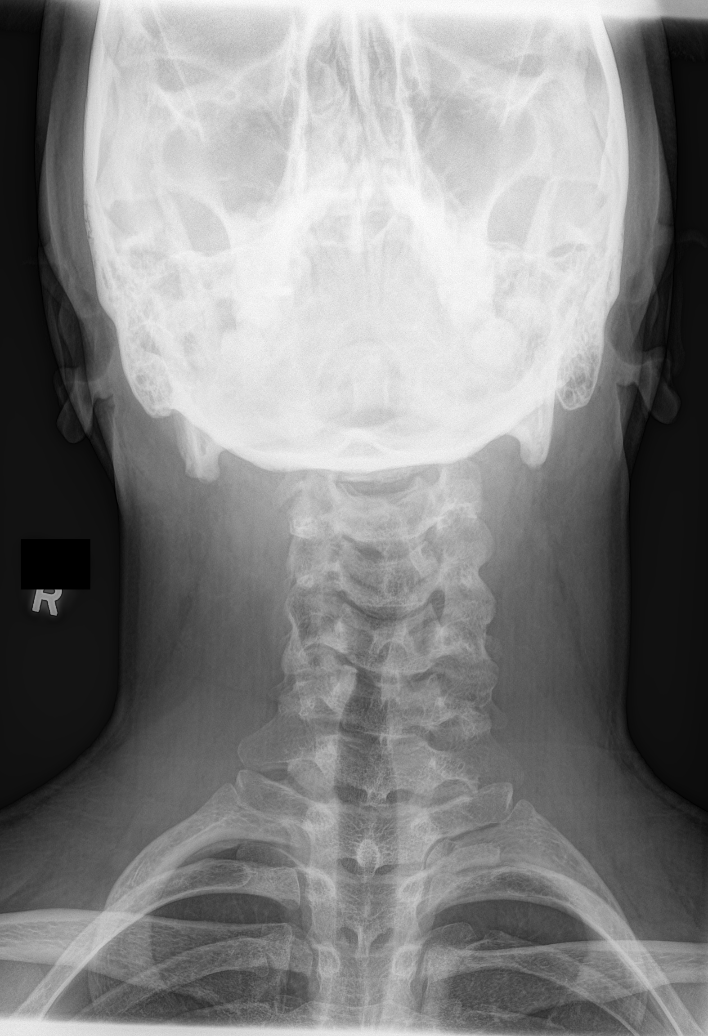
[im 5/6]
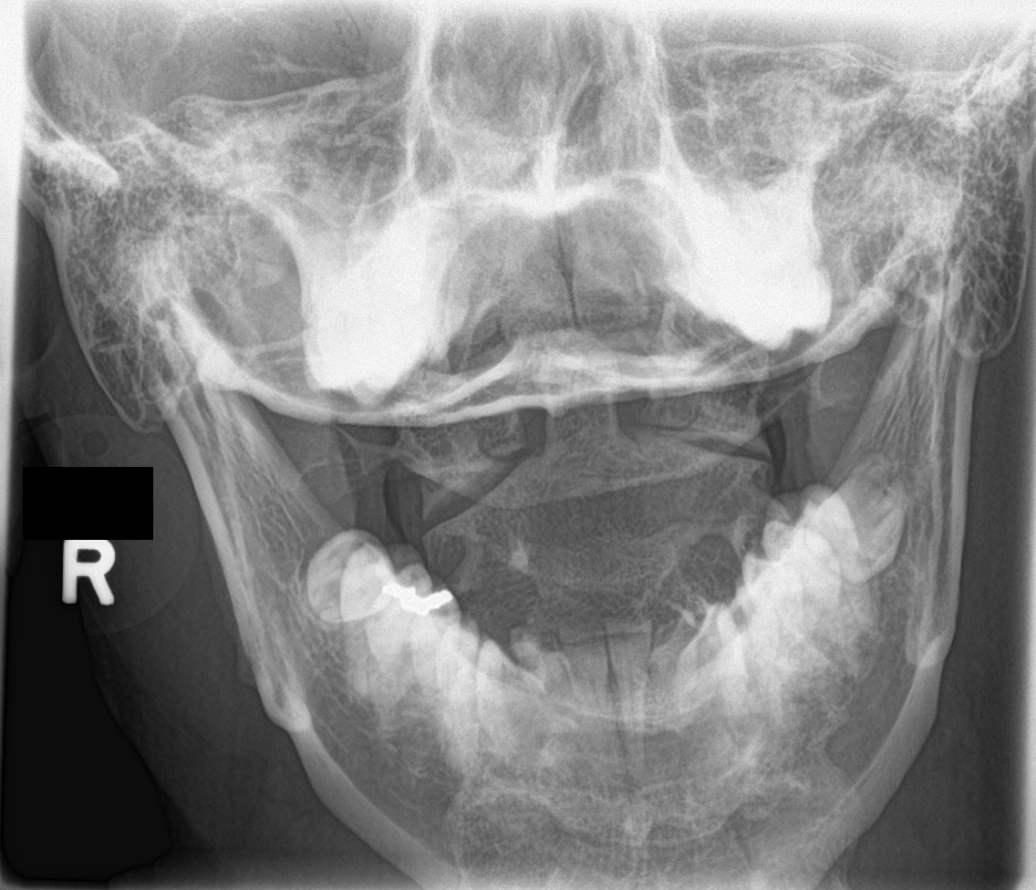
[im 6/6]
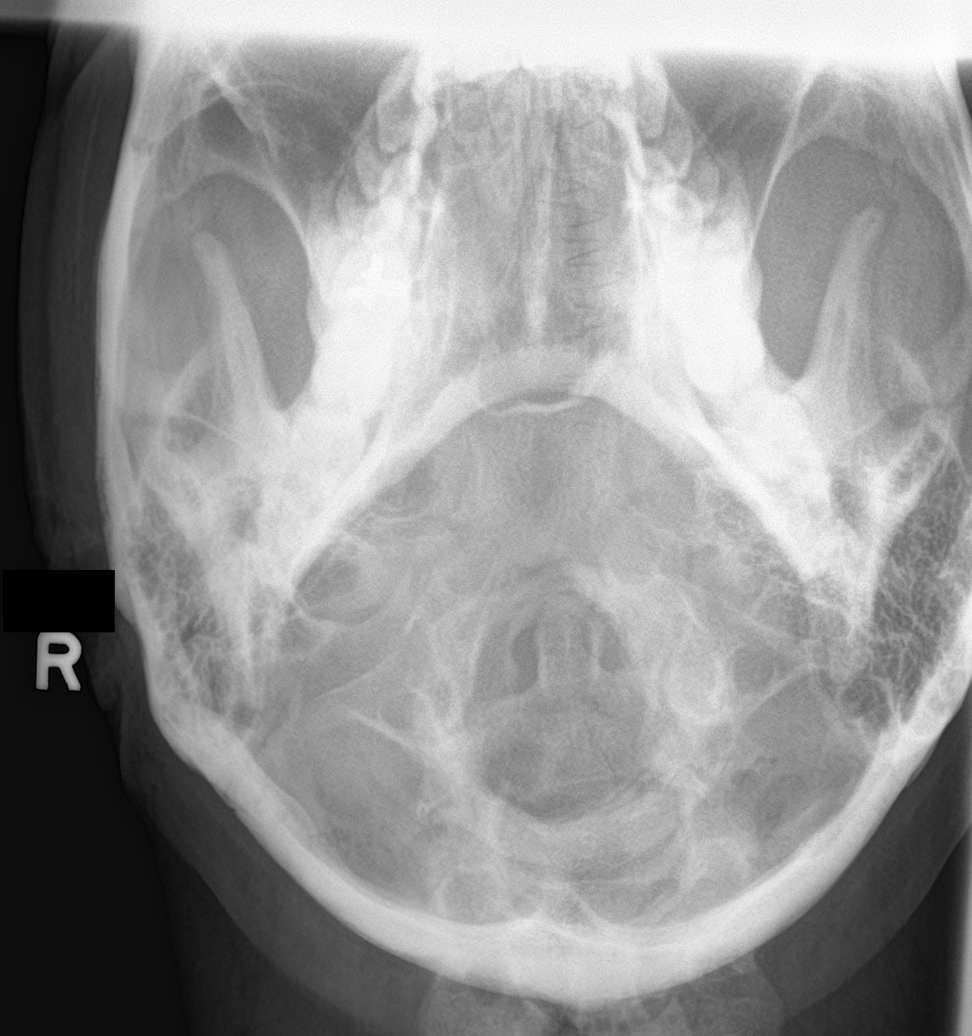

[6 of 6 positions shown; findings below may reference images not displayed]

FINDINGS: No acute bony or joint abnormality identified. No evidence of
fracture or dislocation.
IMPRESSION: No acute abnormality.

## 2017-08-21 MED ORDER — PREDNISONE 20 MG PO TABS: 20.0000 mg | ORAL_TABLET | Freq: Every day | ORAL | 0 refills | Status: DC

## 2017-08-21 NOTE — Addendum Note (Signed)
Addended by: Mila MerryFISHER, DONALD E on: 08/21/2017 02:16 PM   Modules accepted: Level of Service

## 2017-08-21 NOTE — Progress Notes (Signed)
Patient: Victor ShoemakerBradley P Fowler Male    DOB: March 19, 1996   21 y.o.   MRN: [id number] Visit Date: 08/21/2017  Today's Provider: Mila Merryonald Fisher, MD   Chief Complaint  Patient presents with  . Abdominal Pain   Subjective:    Abdominal Pain  This is a recurrent problem. Episode onset: 2 months. The problem occurs intermittently. The problem has been gradually worsening. Pain location: right side. Quality: pressure, sometimes sharp stabbing pain. Associated symptoms include diarrhea (occurs sometimes), nausea and vomiting. Pertinent negatives include no fever. Treatments tried: Pepto Bismol. The treatment provided no relief.  Was last evaluated 11/27 with suspected viral gastroenteritis, however sx did not improve. He had negative h. Pylori breath test and UGI series showing as follows  Normal appearance of the stomach, duodenum, and jejunum.  Some loss of the normal mucosal pattern of the jejunum with separation of bowel loops may reflect inflammatory change tenderness to palpation over this region was observed. Correlation with the outside abdominal CT scan performed recently would be useful. The findings could reflect inflammatory bowel disease.  Findings were initially thought to also be due to viral GE, however he states that since that time he continues to have pain every day, always in RUQ, not related to food or eating, and sometimes keeping him awake at night. He continues to have episodes or nausea, but no more vomiting. Has had alternating episodes of diarrhea and constipation.  He also reports feeling discomfort right neck arm and leg off and on. His mother reports that prior to his first visit to ED in October he was weak on his right side and had trouble standing. States he still feels a little weak on left side at times. Has also felt tightness on right side of his neck. He does work as a [employer]at[employer]ic and does a lot of overhead work.     No Known Allergies   Current Outpatient  Medications:  .  famotidine (PEPCID) 20 MG tablet, Take 20 mg by mouth., Disp: , Rfl:  .  promethazine (PHENERGAN) 25 MG tablet, Take 1 tablet (25 mg total) by mouth every 8 (eight) hours as needed for nausea or vomiting., Disp: 20 tablet, Rfl: 0  Review of Systems  Constitutional: Positive for chills. Negative for appetite change and fever.  Respiratory: Negative for chest tightness, shortness of breath and wheezing.   Cardiovascular: Negative for chest pain and palpitations.  Gastrointestinal: Positive for abdominal pain (right side), diarrhea (occurs sometimes), nausea and vomiting.  Neurological: Positive for dizziness and weakness (on the right side of his body).    Social History   Tobacco Use  . Smoking status: Never Smoker  . Smokeless tobacco: Never Used  Substance Use Topics  . Alcohol use: No   Objective:   BP 122/82 (BP Location: Right Arm, Patient Position: Sitting, Cuff Size: Large)   Pulse 94   Temp 97.6 F (36.4 C) (Oral)   Resp 16   Wt 185 lb (83.9 kg)   SpO2 97% Comment: room air  BMI 28.13 kg/m  Vitals:   08/21/17 1037  BP: 122/82  Pulse: 94  Resp: 16  Temp: 97.6 F (36.4 C)  TempSrc: Oral  SpO2: 97%  Weight: 185 lb (83.9 kg)     Physical Exam   General Appearance:    Alert, cooperative, no distress  Eyes:    PERRL, conjunctiva/corneas clear, EOM's intact       Lungs:     Clear to auscultation  bilaterally, respirations unlabored  Heart:    Regular rate and rhythm  Neurologic:   Awake, alert, oriented x 3. No apparent focal neurological           defect.           Assessment & Plan:     1. Abdominal pain, unspecified abdominal location Some indication of jejunal inflammation on recent UGI study. Trial of prednisone. Refer GI for further evaluation - predniSONE (DELTASONE) 20 MG tablet; Take 1 tablet (20 mg total) by mouth daily with breakfast.  Dispense: 14 tablet; Refill: 0 - Ambulatory referral to Gastroenterology  2. Neck pain Likely  musculoskeletal. Unclear if this is related to right sided weakness he is experiencing. See how xray looks.  - DG Cervical Spine Complete; Future  3. Nausea and vomiting, intractability of vomiting not specified, unspecified vomiting type Mostly resolved since last visit.        Mila Merryonald Fisher, MD  [facility]

## 2017-08-22 ENCOUNTER — Telehealth: Payer: Self-pay | Admitting: *Deleted

## 2017-08-22 DIAGNOSIS — R531 Weakness: Secondary | ICD-10-CM

## 2017-08-22 NOTE — Telephone Encounter (Signed)
Patient was notified of results. Expressed understanding. CT ordered. 

## 2017-08-22 NOTE — Telephone Encounter (Signed)
Please schedule CT? Thanks!

## 2017-08-22 NOTE — Telephone Encounter (Signed)
-----   Message from Malva Limesonald E Fisher, MD sent at 08/22/2017  2:50 PM EST ----- Xray of neck is normal, no explanation of pain or right sided weakness, recommend head CT for evaluation of right sided weakness.

## 2017-08-25 ENCOUNTER — Telehealth: Payer: Self-pay | Admitting: Family Medicine

## 2017-08-25 DIAGNOSIS — M6281 Muscle weakness (generalized): Secondary | ICD-10-CM

## 2017-08-25 DIAGNOSIS — R531 Weakness: Secondary | ICD-10-CM

## 2017-08-25 NOTE — Telephone Encounter (Signed)
Order in epic. 

## 2017-08-25 NOTE — Telephone Encounter (Signed)
Per CT tech contrast is not needed for diagnosis that was given.Test should be CT  W/O only

## 2017-08-28 ENCOUNTER — Telehealth: Payer: Self-pay | Admitting: Family Medicine

## 2017-08-28 DIAGNOSIS — R531 Weakness: Secondary | ICD-10-CM

## 2017-08-28 NOTE — Telephone Encounter (Signed)
FYI--Insurance company denied CT of head

## 2017-08-28 NOTE — Telephone Encounter (Signed)
FYI

## 2017-09-01 NOTE — Telephone Encounter (Signed)
lmtcb

## 2017-09-01 NOTE — Telephone Encounter (Signed)
Please advise patient insurance won't approve head CT. I would recommend referral to neurology for weakness of right side.

## 2017-09-03 ENCOUNTER — Other Ambulatory Visit: Payer: Self-pay

## 2017-09-03 ENCOUNTER — Other Ambulatory Visit
Admission: RE | Admit: 2017-09-03 | Discharge: 2017-09-03 | Disposition: A | Discharge status: Home / Self Care | Payer: Managed Care, Other (non HMO) | Source: Ambulatory Visit | Attending: Gastroenterology | Admitting: Gastroenterology

## 2017-09-03 ENCOUNTER — Encounter: Payer: Self-pay | Admitting: Gastroenterology

## 2017-09-03 ENCOUNTER — Ambulatory Visit (INDEPENDENT_AMBULATORY_CARE_PROVIDER_SITE_OTHER): Payer: Managed Care, Other (non HMO) | Admitting: Gastroenterology

## 2017-09-03 VITALS — BP 122/83 | HR 78 | Ht 65.0 in | Wt 179.6 lb

## 2017-09-03 DIAGNOSIS — G43A1 Cyclical vomiting, intractable: Secondary | ICD-10-CM

## 2017-09-03 DIAGNOSIS — K219 Gastro-esophageal reflux disease without esophagitis: Secondary | ICD-10-CM

## 2017-09-03 DIAGNOSIS — R1115 Cyclical vomiting syndrome unrelated to migraine: Secondary | ICD-10-CM

## 2017-09-03 DIAGNOSIS — K5900 Constipation, unspecified: Secondary | ICD-10-CM | POA: Diagnosis not present

## 2017-09-03 LAB — URINE DRUG SCREEN, QUALITATIVE (ARMC ONLY)
Amphetamines, Ur Screen: NOT DETECTED
Barbiturates, Ur Screen: NOT DETECTED
Benzodiazepine, Ur Scrn: NOT DETECTED
Cannabinoid 50 Ng, Ur ~~LOC~~: NOT DETECTED
Cocaine Metabolite,Ur ~~LOC~~: NOT DETECTED
MDMA (Ecstasy)Ur Screen: NOT DETECTED
Methadone Scn, Ur: NOT DETECTED
Opiate, Ur Screen: NOT DETECTED
Phencyclidine (PCP) Ur S: NOT DETECTED
Tricyclic, Ur Screen: NOT DETECTED

## 2017-09-03 LAB — COMPREHENSIVE METABOLIC PANEL
ALT: 35 U/L (ref 17–63)
AST: 23 U/L (ref 15–41)
Albumin: 4.6 g/dL (ref 3.5–5.0)
Alkaline Phosphatase: 71 U/L (ref 38–126)
Anion gap: 7 (ref 5–15)
BUN: 22 mg/dL — ABNORMAL HIGH (ref 6–20)
CO2: 26 mmol/L (ref 22–32)
Calcium: 9.3 mg/dL (ref 8.9–10.3)
Chloride: 105 mmol/L (ref 101–111)
Creatinine, Ser: 0.83 mg/dL (ref 0.61–1.24)
GFR calc Af Amer: >60 mL/min (ref >60)
GFR calc non Af Amer: >60 mL/min (ref >60)
Glucose, Bld: 123 mg/dL — ABNORMAL HIGH (ref 65–99)
Potassium: 3.6 mmol/L (ref 3.5–5.1)
Sodium: 138 mmol/L (ref 135–145)
Total Bilirubin: 0.7 mg/dL (ref 0.3–1.2)
Total Protein: 7.4 g/dL (ref 6.5–8.1)

## 2017-09-03 LAB — CBC WITH DIFFERENTIAL/PLATELET
Basophils Absolute: 0.1 10*3/uL (ref 0–0.1)
Basophils Relative: 1 %
Eosinophils Absolute: 0.1 10*3/uL (ref 0–0.7)
Eosinophils Relative: 1 %
HCT: 47 % (ref 40.0–52.0)
Hemoglobin: 16.1 g/dL (ref 13.0–18.0)
Lymphocytes Relative: 21 %
Lymphs Abs: 2.2 10*3/uL (ref 1.0–3.6)
MCH: 31.6 pg (ref 26.0–34.0)
MCHC: 34.2 g/dL (ref 32.0–36.0)
MCV: 92.3 fL (ref 80.0–100.0)
Monocytes Absolute: 0.7 10*3/uL (ref 0.2–1.0)
Monocytes Relative: 7 %
Neutro Abs: 7.1 10*3/uL — ABNORMAL HIGH (ref 1.4–6.5)
Neutrophils Relative %: 70 %
Platelets: 300 10*3/uL (ref 150–440)
RBC: 5.09 MIL/uL (ref 4.40–5.90)
RDW: 13.3 % (ref 11.5–14.5)
WBC: 10.2 10*3/uL (ref 3.8–10.6)

## 2017-09-03 LAB — SEDIMENTATION RATE: Sed Rate: 1 mm/hr (ref 0–15)

## 2017-09-03 LAB — C-REACTIVE PROTEIN: CRP: 1.2 mg/dL — ABNORMAL HIGH (ref <1.0)

## 2017-09-03 MED ORDER — OMEPRAZOLE 20 MG PO CPDR: 20.0000 mg | DELAYED_RELEASE_CAPSULE | Freq: Two times a day (BID) | ORAL | 1 refills | Status: DC

## 2017-09-03 MED ORDER — POLYETHYLENE GLYCOL 3350 17 G PO PACK: 17.0000 g | PACK | Freq: Every day | ORAL | 1 refills | Status: DC

## 2017-09-03 NOTE — Patient Instructions (Addendum)
Needs labs: CBC, CMP, ESR, CRP, FECAL CAL PR., STOOL CULTURES, Hi-pyloric serology. Buy bed wedge. Given GERD handout.  Please take Omeprazole twice daily for 30 days then decrease to once daily after 30 days.

## 2017-09-03 NOTE — Progress Notes (Signed)
Victor Fowler [address]  [address]  [place], [place] [address]  Main: [phone]  Fax: [phone]   Gastroenterology Consultation  Referring Provider:     Malva Limes, MD Primary Care Physician:  Malva Limes, MD Primary Gastroenterologist:  Dr. Melodie Fowler Reason for Consultation:     Abdominal pain        HPI:   Victor Fowler is a 21 y.o. y/o male referred for consultation & management  by Dr. Sherrie Mustache, Demetrios Isaacs, MD.  Patient presents for 34-month history of abdominal pain, right upper quadrant, pressure-like, 5/10, intermittent, associated with nausea vomiting and diarrhea at times, also associated with heartburn, with no weight loss.  Abdominal pain improves after a bowel movement.  Does not worsen with eating.  Symptoms initially started patient describes nausea, with emesis of food he ate the day before, and multiple loose bowel movements that same day.  The last time this occurred was 4 weeks ago.  No fever, chills.  No bright red blood per rectum.  As of the last 4 weeks, he describes 1 formed bowel movement daily.  No emesis or nausea.  Is tolerating a regular diet at this time. He took some Pepto-Bismol recently and noticed black stool after that 4-5 days ago.  This only occurred once and resolved.  Last bowel movement was brown in color.  No dysphagia.  No previous EGD or colonoscopy.  No family history of colon cancer.  No abdominal surgery.  Was started on Pepcid 4 weeks ago that he only takes intermittently and not every day.  Reports a lot of heartburn symptoms every day for years.  Patient denies any smoking, alcohol or drug use.  Due to symptoms he has undergone recent workup.  He had a CT abdomen with IV contrast on October 27 that showed no acute pathology.  It noted an unremarkable liver.  No biliary duct dilation.  Unremarkable gallbladder.  Unremarkable pancreas.  No bowel obstruction.  No acute inflammatory process.  Appendix not definitely  visualized but no secondary signs of appendicitis reported.  Scattered colonic diverticulosis without evidence of diverticulitis.  No ascites.  Patient also underwent gallbladder ultrasound on November 2 that reported a normal gallbladder and normal bile duct with no biliary duct dilation.  The gallbladder was reported to be physiologically distended without sludge or gallstones.  No gallbladder wall thickening was reported.  The initial report reported a normal liver.  However, follow-up addendum stated that diffuse hepatic steatosis with parenchymal sparing was seen at the gallbladder fossa.  He underwent upper GI with small bowel follow-through on December 4 that showed normal appearance of the stomach, duodenum, jejunum.  However, he reported "some loss of the normal mucosal pattern of the jejunum with separation of bowel loops may reflect inflammatory change."   Past Medical History:  Diagnosis Date  . Fatty liver     Past Surgical History:  Procedure Laterality Date  . none      Prior to Admission medications   Medication Sig Start Date End Date Taking? Authorizing Provider  omeprazole (PRILOSEC) 20 MG capsule Take 1 capsule (20 mg total) by mouth 2 (two) times daily before a meal. 09/03/17 10/03/17  Pasty Spillers, MD  predniSONE (DELTASONE) 20 MG tablet Take 1 tablet (20 mg total) by mouth daily with breakfast. 08/21/17   Malva Limes, MD  promethazine (PHENERGAN) 25 MG tablet Take 1 tablet (25 mg total) by mouth every 8 (eight) hours as needed  for nausea or vomiting. 08/05/17   Malva LimesFisher, Donald E, MD    Family History  Problem Relation Age of Onset  . Cancer Maternal Grandmother        lung cancer, smoked  . Hypertension Maternal Grandfather   . Heart failure Maternal Grandfather   . Cancer Paternal Grandmother        lung (smoked)  . Diabetes Paternal Grandfather   . Hypertension Paternal Grandfather   . Colon cancer Neg Hx   . Breast cancer Neg Hx      Social  History   Tobacco Use  . Smoking status: Never Smoker  . Smokeless tobacco: Never Used  Substance Use Topics  . Alcohol use: No  . Drug use: No    Allergies as of 09/03/2017  . (No Known Allergies)    Review of Systems:    All systems reviewed and negative except where noted in HPI.   Physical Exam:  BP 122/83   Pulse 78   Ht 5\' 5"  (1.651 m)   Wt 179 lb 9.6 oz (81.5 kg)   BMI 29.89 kg/m  No LMP for male patient. Psych:  Alert and cooperative. Normal mood and affect. General:   Alert,  Well-developed, well-nourished, pleasant and cooperative in NAD Head:  Normocephalic and atraumatic. Eyes:  Sclera clear, no icterus.   Conjunctiva pink. Ears:  Normal auditory acuity. Nose:  No deformity, discharge, or lesions. Mouth:  No deformity or lesions,oropharynx pink & moist. Neck:  Supple; no masses or thyromegaly. Lungs:  Respirations even and unlabored.  Clear throughout to auscultation.   No wheezes, crackles, or rhonchi. No acute distress. Heart:  Regular rate and rhythm; no murmurs, clicks, rubs, or gallops. Abdomen:  Normal bowel sounds.  No bruits.  Soft, non-tender and non-distended without masses, hepatosplenomegaly or hernias noted.  No guarding or rebound tenderness.    Msk:  Symmetrical without gross deformities. Good, equal movement & strength bilaterally. Pulses:  Normal pulses noted. Extremities:  No clubbing or edema.  No cyanosis. Neurologic:  Alert and oriented x3;  grossly normal neurologically. Skin:  Intact without significant lesions or rashes. No jaundice. Lymph Nodes:  No significant cervical adenopathy. Psych:  Alert and cooperative. Normal mood and affect.   Labs: CBC    Component Value Date/Time   WBC 9.3 07/05/2017 0037   RBC 4.91 07/05/2017 0037   HGB 15.8 07/05/2017 0037   HGB 16.3 11/26/2012 1406   HCT 45.2 07/05/2017 0037   HCT 47.1 11/26/2012 1406   PLT 280 07/05/2017 0037   PLT 312 11/26/2012 1406   MCV 92.2 07/05/2017 0037   MCV 91  11/26/2012 1406   MCH 32.2 07/05/2017 0037   MCHC 34.9 07/05/2017 0037   RDW 13.0 07/05/2017 0037   RDW 12.8 11/26/2012 1406   CMP     Component Value Date/Time   NA 137 07/29/2017 1103   NA 139 11/26/2012 1406   K 4.2 07/29/2017 1103   K 3.8 11/26/2012 1406   CL 103 07/29/2017 1103   CL 104 11/26/2012 1406   CO2 25 07/29/2017 1103   CO2 28 (H) 11/26/2012 1406   GLUCOSE 101 (H) 07/29/2017 1103   GLUCOSE 122 (H) 11/26/2012 1406   BUN 11 07/29/2017 1103   BUN 16 11/26/2012 1406   CREATININE 0.90 07/29/2017 1103   CALCIUM 10.1 07/29/2017 1103   CALCIUM 9.3 11/26/2012 1406   PROT 7.3 07/29/2017 1103   AST 18 07/29/2017 1103   ALT 25 07/29/2017 1103  BILITOT 0.6 07/29/2017 1103   GFRNONAA 121 07/29/2017 1103   GFRAA 140 07/29/2017 1103   Imaging Studies: Dg Cervical Spine Complete  Result Date: 08/21/2017 CLINICAL DATA:  Pain and stiffness.  No injury. EXAM: CERVICAL SPINE - COMPLETE 4+ VIEW COMPARISON:  None. FINDINGS: No acute bony or joint abnormality identified. No evidence of fracture or dislocation. IMPRESSION: No acute abnormality. Electronically Signed   By: Maisie Fus  Register   On: 08/21/2017 13:08   Dg Kayleen Memos W/small Bowel  Result Date: 08/12/2017 CLINICAL DATA:  Right-sided abdominal pain for the past month. EXAM: UPPER GI SERIES WITH SMALL BOWEL FOLLOW-THROUGH using barium FLUOROSCOPY TIME:  Fluoroscopy Time:  1 minutes, 30 seconds Radiation Exposure Index (if provided by the fluoroscopic device): 1133 micro Gy per meters square Number of Acquired Spot Images: 13 TECHNIQUE: Combined double contrast and single contrast upper GI series using effervescent crystals, thick barium, and thin barium. Subsequently, serial images of the small bowel were obtained including spot views of the terminal ileum. After obtaining a scout radiograph a routine upper GI series was performed using thin and high density barium. Effervescent crystals and a barium tablet were administered COMPARISON:   None available in PACs FINDINGS: The scout film reveals a normal bowel gas pattern. There are no abnormal soft tissue calcifications. The patient ingested thick and thin barium and gas forming crystals without difficulty. The thoracic esophagus was normal in a survey fashion. The barium tablet passed promptly from the mouth to the stomach. The stomach distended well. The mucosal fold pattern was normal. No ulcer niche was observed. Gastric emptying was prompt. The duodenal bulb and C sweep were normal. No evidence of a duodenal ulcer or duodenal inflammation. Over the course of approximately 20 minutes the barium passed from the stomach to the right colon. The jejunum demonstrated a normal feathery mucosal pattern. Within the ileum there was some flocculation of the barium with some loss of normally expected mucosal fold pattern. There was tenderness in the right lower quadrant over the terminal ileum. Peristalsis through the terminal ileum appeared normal however. There was some separation of distal small bowel loops from one-another in the pelvis and right lower quadrant. IMPRESSION: Normal appearance of the stomach, duodenum, and jejunum. Some loss of the normal mucosal pattern of the jejunum with separation of bowel loops may reflect inflammatory change tenderness to palpation over this region was observed. Correlation with the outside abdominal CT scan performed recently would be useful. The findings could reflect inflammatory bowel disease. Electronically Signed   By: David  [place] M.D.   On: 08/12/2017 09:45    Assessment and Plan:   DEAVIN FORST is a 21 y.o. y/o male has been referred for right upper quadrant abdominal pain, history of heartburn was reassuring clinical exam and workup so far  Patient symptoms that started 2 months ago and have now resolved were likely due to a viral gastroenteritis at that time.  Patient had nausea/vomiting with diarrhea at that time and now this has resolved.  He  has not had diarrhea or vomiting in 3-4 weeks.  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  However patient is reporting normal bowel movements at this time.  He has no blood in stool or any other alarm symptoms to indicate endoscopy at this time.  Symptoms are not chronic, he does not blood streaks in his stool, no symptoms of IBD.  We will obtain blood work with inflammatory markers to evaluate for any underlying anemia.  If inflammatory markers are elevated or patient has anemia can consider endoscopy at that time.  Patient had one episode of dark black stool after taking Pepto-Bismol which resolved.  This occurred 4-5 days ago and thus CBC is indicated.  We will also obtain fecal Calprotectin.  Right upper quadrant pain is likely related to GERD.  Will change from Pepcid to Prilosec.  Patient educated extensively on acid reflux lifestyle modification and handout given for the same.  Patient asked to buy a bed wedge as that has shown in studies to help with GERD. Risks of PPI use were discussed with patient including bone loss, C. Diff diarrhea, pneumonia, infections, CKD, electrolyte abnormalities. Pt. Verbalizes understanding and chooses to continue the medication.    Patient also asked to maintain a high-fiber diet and avoid constipation.  Asked to have a goal of 1-2 soft bowel movements daily without straining.  If not at goal, patient was asked to start MiraLAX daily.  His abdominal pain improves after a bowel movement as well, thus indicating that his abdominal pain might be related to constipation.  Hepatic steatosis was not seen in his CT scan and only reported on his ultrasound as an addendum.  We have encouraged weight loss and a healthy diet.  No Clinical evidence of cirrhosis.  Can assess with future ultrasounds in 6 months.  Patient on his last day of prednisone today that was prescribed by PCP due to the questionable abnormal mucosa seen on his upper GI  study.  Given that this was not seen on the CT, and he does not have any clinical symptoms to correlate to IBD at this time, the significance of this is not known.  It was likely an over read since CT did not report any inflammatory changes in his bowels.  We will continue to observe clinical course and review above workup.  Dr Victor BouillonVarnita Devario Bucklew

## 2017-09-04 ENCOUNTER — Telehealth: Payer: Self-pay | Admitting: Gastroenterology

## 2017-09-04 LAB — H. PYLORI ANTIBODY, IGG: H Pylori IgG: <0.8 Index Value (ref 0.00–0.79)

## 2017-09-04 NOTE — Telephone Encounter (Signed)
Patient left a voice message that he is returning your call regarding test results. Please call

## 2017-09-08 NOTE — Telephone Encounter (Signed)
Patients wife was notified. Agreed to referral.

## 2017-09-08 NOTE — Telephone Encounter (Signed)
Please schedule referral to neurology? Thanks! 

## 2017-09-15 ENCOUNTER — Ambulatory Visit: Payer: Managed Care, Other (non HMO) | Admitting: Family Medicine

## 2017-09-15 ENCOUNTER — Telehealth: Payer: Self-pay | Admitting: Gastroenterology

## 2017-09-15 ENCOUNTER — Encounter: Payer: Self-pay | Admitting: Family Medicine

## 2017-09-15 VITALS — BP 132/80 | HR 108 | Temp 98.8°F | Resp 16 | Wt 182.0 lb

## 2017-09-15 DIAGNOSIS — R109 Unspecified abdominal pain: Secondary | ICD-10-CM

## 2017-09-15 DIAGNOSIS — R112 Nausea with vomiting, unspecified: Secondary | ICD-10-CM

## 2017-09-15 DIAGNOSIS — R197 Diarrhea, unspecified: Secondary | ICD-10-CM | POA: Diagnosis not present

## 2017-09-15 NOTE — Telephone Encounter (Signed)
Please call patient to set up procedure.He can be reach @ [phone].

## 2017-09-15 NOTE — Progress Notes (Signed)
       Patient: Victor Fowler Male    DOB: 09/01/1996   22 y.o.   MRN: [id number] Visit Date: 09/15/2017  Today's Provider: Mila Merryonald Fisher, MD   Chief Complaint  Patient presents with  . Follow-up   Subjective:    HPI Follow up of abdominal pain  Was last seen here  12/13 with recurrent generalized, crampy abdominal pain. Was prescribed 14 days of prednisone due to findings suggestive of in inflammation of jejunum reported on upper GI series. He states pain greatly improved after a few days and mostly resolved by the time he saw Dr Maximino Greenlandahiliani on 12/26.  He has since finished prednisone but remains on PPI. He states that over the last two weeks pain has returned and is severe now as it every has been. He continues to have frequent episodes of diarrhea, but not blood. Feels nauseated with occasional episodes of vomiting. GI plan was to proceed with upper and/or lower endoscopy if pain were to recur.   No Known Allergies   Current Outpatient Medications:  .  omeprazole (PRILOSEC) 20 MG capsule, Take 1 capsule (20 mg total) by mouth 2 (two) times daily before a meal., Disp: 60 capsule, Rfl: 1  Review of Systems  Constitutional: Negative for appetite change, chills and fever.  Respiratory: Negative for chest tightness, shortness of breath and wheezing.   Cardiovascular: Negative for chest pain and palpitations.  Gastrointestinal: Positive for abdominal pain. Negative for nausea and vomiting.    Social History   Tobacco Use  . Smoking status: Never Smoker  . Smokeless tobacco: Never Used  Substance Use Topics  . Alcohol use: No   Objective:   BP 132/80 (BP Location: Left Arm, Patient Position: Sitting, Cuff Size: Large)   Pulse (!) 108   Temp 98.8 F (37.1 C) (Oral)   Resp 16   Wt 182 lb (82.6 kg)   SpO2 95% Comment: room air  BMI 30.29 kg/m  There were no vitals filed for this visit.   Physical Exam  General Appearance:    Alert, cooperative, no distress  Eyes:     PERRL, conjunctiva/corneas clear, EOM's intact       Lungs:     Clear to auscultation bilaterally, respirations unlabored  Heart:    Regular rate and rhythm  Abdomen:   Mild diffuse abdominal tenderness, no rebound or guarding, no masses, bowel sounds hypoactive throughout. No point tenderness.       Assessment & Plan:     1. Abdominal pain, unspecified abdominal location Had nearly resolved when he was on prednisone, but symptoms have since returned. He is going to contact GI for follow up and will likely require GI endoscopy.   2. Nausea and vomiting, intractability of vomiting not specified, unspecified vomiting type   3. Diarrhea, unspecified type Unchanged. Follow up GI as above.        Mila Merryonald Fisher, MD  [facility]

## 2017-09-16 NOTE — Telephone Encounter (Signed)
Dr. Wende Neighborsahiliana,  Is this pt needing an Endoscopy for N/V, intractability of vomiting and colonoscopy-diarrhea, abdominal pain?  Thanks!

## 2017-09-18 ENCOUNTER — Other Ambulatory Visit: Payer: Self-pay

## 2017-09-18 DIAGNOSIS — R1084 Generalized abdominal pain: Secondary | ICD-10-CM

## 2017-09-18 DIAGNOSIS — R112 Nausea with vomiting, unspecified: Secondary | ICD-10-CM

## 2017-09-18 DIAGNOSIS — R197 Diarrhea, unspecified: Secondary | ICD-10-CM

## 2017-09-19 NOTE — Telephone Encounter (Signed)
Colonoscopy/endoscopy scheduled for 09/30/17.

## 2017-09-22 ENCOUNTER — Other Ambulatory Visit: Payer: Self-pay

## 2017-09-24 NOTE — Progress Notes (Signed)
Colonoscopy and EGD scheduled for 09/30/17.

## 2017-09-29 ENCOUNTER — Telehealth: Payer: Self-pay | Admitting: Gastroenterology

## 2017-09-29 ENCOUNTER — Ambulatory Visit: Payer: Managed Care, Other (non HMO) | Admitting: Gastroenterology

## 2017-09-29 NOTE — Telephone Encounter (Signed)
JACKIE WITH [facility] CALLED & WANTED TO CLARIFY THE PRESCRIPTION FOR MURALX.THE PATIENT IS HAVING HIS COLONOSCOPY TOMORROW 09-30-17. THE SCIPT WAS FOR MURALX PACKETS AND SHE TINKS IT SHOULD BE FOR THE 255 MURALX. PLEASE CALL.

## 2017-09-29 NOTE — Telephone Encounter (Signed)
Contacted pharmacy and it is taken care of. Pt did not realize that he the prescription for the prep.

## 2017-09-29 NOTE — Discharge Instructions (Signed)
General Anesthesia, Adult, Care After °These instructions provide you with information about caring for yourself after your procedure. Your health care provider may also give you more specific instructions. Your treatment has been planned according to current medical practices, but problems sometimes occur. Call your health care provider if you have any problems or questions after your procedure. °What can I expect after the procedure? °After the procedure, it is common to have: °· Vomiting. °· A sore throat. °· Mental slowness. ° °It is common to feel: °· Nauseous. °· Cold or shivery. °· Sleepy. °· Tired. °· Sore or achy, even in parts of your body where you did not have surgery. ° °Follow these instructions at home: °For at least 24 hours after the procedure: °· Do not: °? Participate in activities where you could fall or become injured. °? Drive. °? Use heavy machinery. °? Drink alcohol. °? Take sleeping pills or medicines that cause drowsiness. °? Make important decisions or sign legal documents. °? Take care of children on your own. °· Rest. °Eating and drinking °· If you vomit, drink water, juice, or soup when you can drink without vomiting. °· Drink enough fluid to keep your urine clear or pale yellow. °· Make sure you have little or no nausea before eating solid foods. °· Follow the diet recommended by your health care provider. °General instructions °· Have a responsible adult stay with you until you are awake and alert. °· Return to your normal activities as told by your health care provider. Ask your health care provider what activities are safe for you. °· Take over-the-counter and prescription medicines only as told by your health care provider. °· If you smoke, do not smoke without supervision. °· Keep all follow-up visits as told by your health care provider. This is important. °Contact a health care provider if: °· You continue to have nausea or vomiting at home, and medicines are not helpful. °· You  cannot drink fluids or start eating again. °· You cannot urinate after 8-12 hours. °· You develop a skin rash. °· You have fever. °· You have increasing redness at the site of your procedure. °Get help right away if: °· You have difficulty breathing. °· You have chest pain. °· You have unexpected bleeding. °· You feel that you are having a life-threatening or urgent problem. °This information is not intended to replace advice given to you by your health care provider. Make sure you discuss any questions you have with your health care provider. °Document Released: 12/02/2000 Document Revised: 01/29/2016 Document Reviewed: 08/10/2015 °Elsevier Interactive Patient Education © 2018 [employer]vier Inc. ° °

## 2017-09-30 ENCOUNTER — Encounter
Admission: RE | Disposition: A | Discharge status: Home / Self Care | Payer: Self-pay | Source: Ambulatory Visit | Attending: Gastroenterology

## 2017-09-30 ENCOUNTER — Ambulatory Visit: Payer: Managed Care, Other (non HMO) | Admitting: Anesthesiology

## 2017-09-30 ENCOUNTER — Ambulatory Visit
Admission: RE | Admit: 2017-09-30 | Discharge: 2017-09-30 | Disposition: A | Discharge status: Home / Self Care | Payer: Managed Care, Other (non HMO) | Source: Ambulatory Visit | Attending: Gastroenterology | Admitting: Gastroenterology

## 2017-09-30 DIAGNOSIS — K6289 Other specified diseases of anus and rectum: Secondary | ICD-10-CM | POA: Diagnosis not present

## 2017-09-30 DIAGNOSIS — K3189 Other diseases of stomach and duodenum: Secondary | ICD-10-CM | POA: Diagnosis not present

## 2017-09-30 DIAGNOSIS — Q600 Renal agenesis, unilateral: Secondary | ICD-10-CM | POA: Insufficient documentation

## 2017-09-30 DIAGNOSIS — K219 Gastro-esophageal reflux disease without esophagitis: Secondary | ICD-10-CM | POA: Insufficient documentation

## 2017-09-30 DIAGNOSIS — Z79899 Other long term (current) drug therapy: Secondary | ICD-10-CM | POA: Diagnosis not present

## 2017-09-30 DIAGNOSIS — K760 Fatty (change of) liver, not elsewhere classified: Secondary | ICD-10-CM | POA: Diagnosis not present

## 2017-09-30 DIAGNOSIS — R112 Nausea with vomiting, unspecified: Secondary | ICD-10-CM | POA: Diagnosis not present

## 2017-09-30 DIAGNOSIS — R933 Abnormal findings on diagnostic imaging of other parts of digestive tract: Secondary | ICD-10-CM | POA: Diagnosis not present

## 2017-09-30 DIAGNOSIS — R197 Diarrhea, unspecified: Secondary | ICD-10-CM | POA: Diagnosis not present

## 2017-09-30 DIAGNOSIS — K228 Other specified diseases of esophagus: Secondary | ICD-10-CM

## 2017-09-30 DIAGNOSIS — R1013 Epigastric pain: Secondary | ICD-10-CM | POA: Diagnosis not present

## 2017-09-30 DIAGNOSIS — K449 Diaphragmatic hernia without obstruction or gangrene: Secondary | ICD-10-CM | POA: Diagnosis not present

## 2017-09-30 DIAGNOSIS — K2289 Other specified disease of esophagus: Secondary | ICD-10-CM

## 2017-09-30 DIAGNOSIS — K2940 Chronic atrophic gastritis without bleeding: Secondary | ICD-10-CM | POA: Diagnosis not present

## 2017-09-30 HISTORY — PX: ESOPHAGOGASTRODUODENOSCOPY (EGD) WITH PROPOFOL: SHX5813

## 2017-09-30 HISTORY — PX: COLONOSCOPY WITH PROPOFOL: SHX5780

## 2017-09-30 HISTORY — DX: Renal agenesis, unilateral: Q60.0

## 2017-09-30 SURGERY — COLONOSCOPY WITH PROPOFOL
Anesthesia: General | Site: Throat | Wound class: Contaminated

## 2017-09-30 MED ORDER — PROPOFOL 10 MG/ML IV BOLUS
INTRAVENOUS | Status: DC | PRN
  Administered 2017-09-30: 20 mg via INTRAVENOUS
  Administered 2017-09-30: 60 mg via INTRAVENOUS
  Administered 2017-09-30: 20 mg via INTRAVENOUS
  Administered 2017-09-30: 60 mg via INTRAVENOUS
  Administered 2017-09-30: 20 mg via INTRAVENOUS
  Administered 2017-09-30: 90 mg via INTRAVENOUS
  Administered 2017-09-30 (×2): 20 mg via INTRAVENOUS
  Administered 2017-09-30: 60 mg via INTRAVENOUS
  Administered 2017-09-30 (×3): 20 mg via INTRAVENOUS
  Administered 2017-09-30: 30 mg via INTRAVENOUS
  Administered 2017-09-30: 20 mg via INTRAVENOUS
  Administered 2017-09-30: 60 mg via INTRAVENOUS
  Administered 2017-09-30 (×2): 20 mg via INTRAVENOUS
  Administered 2017-09-30: 40 mg via INTRAVENOUS
  Administered 2017-09-30 (×2): 20 mg via INTRAVENOUS

## 2017-09-30 MED ORDER — GLYCOPYRROLATE 0.2 MG/ML IJ SOLN
INTRAMUSCULAR | Status: DC | PRN
  Administered 2017-09-30: 0.2 mg via INTRAVENOUS

## 2017-09-30 MED ORDER — STERILE WATER FOR IRRIGATION IR SOLN
Status: DC | PRN
  Administered 2017-09-30: 10:00:00

## 2017-09-30 MED ORDER — LACTATED RINGERS IV SOLN
INTRAVENOUS | Status: DC | PRN
  Administered 2017-09-30: 10:00:00 via INTRAVENOUS

## 2017-09-30 MED ORDER — LIDOCAINE HCL (CARDIAC) 20 MG/ML IV SOLN
INTRAVENOUS | Status: DC | PRN
  Administered 2017-09-30: 25 mg via INTRAVENOUS

## 2017-09-30 SURGICAL SUPPLY — 8 items
BLOCK BITE 60FR ADLT L/F GRN (MISCELLANEOUS) ×3 IMPLANT
CANISTER SUCT 1200ML W/VALVE (MISCELLANEOUS) ×3 IMPLANT
GOWN CVR UNV OPN BCK APRN NK (MISCELLANEOUS) ×4 IMPLANT
GOWN ISOL THUMB LOOP REG UNIV (MISCELLANEOUS) ×2
KIT DEFENDO VALVE AND CONN (KITS) ×3 IMPLANT
KIT ENDO PROCEDURE OLY (KITS) ×3 IMPLANT
SNARE SHORT THROW 13M SML OVAL (MISCELLANEOUS) ×3 IMPLANT
WATER STERILE IRR 250ML POUR (IV SOLUTION) ×3 IMPLANT

## 2017-09-30 NOTE — Op Note (Signed)
[facility] Gastroenterology Patient Name: Victor SolomonsBradley Brinton Procedure Date: 09/30/2017 10:14 AM MRN: [id number] Account #: [id number] Date of Birth: 11-08-1995 Admit Type: Outpatient Age: 22 [facility] Gender: Male Note Status: Finalized Procedure:            Colonoscopy Indications:          Generalized abdominal pain, Clinically significant                        diarrhea of unexplained origin, Suspected Crohn's                        disease of the colon, Suspected Crohn's disease of the                        small bowel, Abnormal small bowel series Providers:            Karine Garn B. Maximino Greenlandahiliani MD, MD Referring MD:         Demetrios Isaacsonald E. Sherrie MustacheFisher, MD (Referring MD) Medicines:            Monitored Anesthesia Care Complications:        No immediate complications. Procedure:            Pre-Anesthesia Assessment:                       - ASA Grade Assessment: II - A patient with mild                        systemic disease.                       - Prior to the procedure, a History and Physical was                        performed, and patient medications, allergies and                        sensitivities were reviewed. The patient's tolerance of                        previous anesthesia was reviewed.                       After obtaining informed consent, the colonoscope was                        passed under direct vision. Throughout the procedure,                        the patient's blood pressure, pulse, and oxygen                        saturations were monitored continuously. The Olympus CF                        H180AL Colonoscope (S#: [id number]) was introduced through                        the anus and advanced to the 20 cm into the ileum. The  colonoscopy was performed with ease. The patient                        tolerated the procedure well. The quality of the bowel                        preparation was fair. Findings:      The  perianal and digital rectal examinations were normal.      The terminal ileum appeared normal. Biopsies were taken with a cold       forceps for histology.      A localized area of moderately erythematous mucosa was found in the       rectum. Biopsies were taken with a cold forceps for histology.      The exam was otherwise normal throughout the examined colon. Random       colon biopsies were performed throughout the colon. The sigmoid colon       biopsies were placed in a separate jar.      There is no endoscopic evidence of stricture or ulcerations in the       entire colon.      The retroflexed view of the distal rectum and anal verge was normal and       showed no anal or rectal abnormalities besides the recal erythema       mentioned above. Impression:           - Preparation of the colon was fair.                       - The examined portion of the ileum was normal.                        Biopsied.                       - Erythematous mucosa in the rectum. Biopsied. Recommendation:       - Await pathology results.                       - Discharge patient to home (with escort).                       - Advance diet as tolerated.                       - Continue present medications.                       - The findings and recommendations were discussed with                        the patient.                       - The findings and recommendations were discussed with                        the patient's family.                       - Return to primary care physician as previously  scheduled.                       - Return to my office in 4 weeks. Procedure Code(s):    --- Professional ---                       [phone], Colonoscopy, flexible; with biopsy, single or                        multiple Diagnosis Code(s):    --- Professional ---                       R93.3, Abnormal findings on diagnostic imaging of other                        parts of digestive  tract                       R19.7, Diarrhea, unspecified                       R10.84, Generalized abdominal pain                       K62.89, Other specified diseases of anus and rectum CPT copyright 2016 [employer] Medical Association. All rights reserved. The codes documented in this report are preliminary and upon coder review may  be revised to meet current compliance requirements.  [name], MD Michel Bickers B. Maximino Greenland MD, MD 09/30/2017 11:02:29 AM This report has been signed electronically. Number of Addenda: 0 Note Initiated On: 09/30/2017 10:14 AM Scope Withdrawal Time: 0 hours 23 minutes 22 seconds  Total Procedure Duration: 0 hours 33 minutes 36 seconds  Estimated Blood Loss: Estimated blood loss: none.      [facility]

## 2017-09-30 NOTE — H&P (Signed)
  Melodie BouillonVarnita Tyreesha Maharaj, MD [address], [address], [place], [place], [address] [address], [address], [place], [place], [address] Phone: [phone]  Fax: [phone]  Primary Care Physician:  Malva LimesFisher, Donald E, MD   Pre-Procedure History & Physical: HPI:  Victor Fowler is a 22 y.o. male is here for an EGD and colonoscopy.   Past Medical History:  Diagnosis Date  . Fatty liver   . Solitary kidney, congenital     Past Surgical History:  Procedure Laterality Date  . none      Prior to Admission medications   Medication Sig Start Date End Date Taking? Authorizing Provider  omeprazole (PRILOSEC) 20 MG capsule Take 1 capsule (20 mg total) by mouth 2 (two) times daily before a meal. 09/03/17 10/03/17 Yes Pasty Spillersahiliani, Franci Oshana B, MD    Allergies as of 09/18/2017  . (No Known Allergies)    Family History  Problem Relation Age of Onset  . Cancer Maternal Grandmother        lung cancer, smoked  . Hypertension Maternal Grandfather   . Heart failure Maternal Grandfather   . Cancer Paternal Grandmother        lung (smoked)  . Diabetes Paternal Grandfather   . Hypertension Paternal Grandfather   . Colon cancer Neg Hx   . Breast cancer Neg Hx     Social History   Socioeconomic History  . Marital status: Married    Spouse name: Not on file  . Number of children: Not on file  . Years of education: Not on file  . Highest education level: Not on file  Social Needs  . Financial resource strain: Not on file  . Food insecurity - worry: Not on file  . Food insecurity - inability: Not on file  . Transportation needs - medical: Not on file  . Transportation needs - non-medical: Not on file  Occupational History  . Not on file  Tobacco Use  . Smoking status: Never Smoker  . Smokeless tobacco: Never Used  Substance and Sexual Activity  . Alcohol use: No  . Drug use: No  . Sexual activity: Not on file  Other Topics Concern  . Not on file  Social History Narrative  . Not on  file    Review of Systems: See HPI, otherwise negative ROS  Physical Exam: BP (!) 147/82   Pulse (!) 109   Temp 99 F (37.2 C)   Ht 5\' 5"  (1.651 m)   Wt 171 lb (77.6 kg)   SpO2 99%   BMI 28.46 kg/m  General:   Alert,  pleasant and cooperative in NAD Head:  Normocephalic and atraumatic. Neck:  Supple; no masses or thyromegaly. Lungs:  Clear throughout to auscultation, normal respiratory effort.    Heart:  +S1, +S2, Regular rate and rhythm, No edema. Abdomen:  Soft, nontender and nondistended. Normal bowel sounds, without guarding, and without rebound.   Neurologic:  Alert and  oriented x4;  grossly normal neurologically.  Impression/Plan: Victor Fowler is here for an colonoscopy to be performed for abdominal pain, EGD for abdominal pain  Risks, benefits, limitations, and alternatives regarding EGD and colonoscopy have been reviewed with the patient.  Questions have been answered.  All parties agreeable.   Pasty SpillersVarnita B Edmund Rick, MD  09/30/2017, 9:42 AM

## 2017-09-30 NOTE — Anesthesia Preprocedure Evaluation (Signed)
Anesthesia Evaluation  Patient identified by MRN, date of birth, ID band Patient awake    Reviewed: Allergy & Precautions, H&P , NPO status , Patient's Chart, lab work & pertinent test results, reviewed documented beta blocker date and time   Airway Mallampati: II  TM Distance: >3 FB Neck ROM: full    Dental no notable dental hx.    Pulmonary neg pulmonary ROS,    Pulmonary exam normal breath sounds clear to auscultation       Cardiovascular Exercise Tolerance: Good negative cardio ROS   Rhythm:regular Rate:Normal     Neuro/Psych negative neurological ROS  negative psych ROS   GI/Hepatic Neg liver ROS, Fatty liver   Endo/Other  negative endocrine ROS  Renal/GU negative Renal ROS  negative genitourinary   Musculoskeletal   Abdominal   Peds  Hematology negative hematology ROS (+)   Anesthesia Other Findings   Reproductive/Obstetrics negative OB ROS                             Anesthesia Physical Anesthesia Plan  ASA: II  Anesthesia Plan: General   Post-op Pain Management:    Induction:   PONV Risk Score and Plan:   Airway Management Planned:   Additional Equipment:   Intra-op Plan:   Post-operative Plan:   Informed Consent: I have reviewed the patients History and Physical, chart, labs and discussed the procedure including the risks, benefits and alternatives for the proposed anesthesia with the patient or authorized representative who has indicated his/her understanding and acceptance.   Dental Advisory Given  Plan Discussed with: CRNA  Anesthesia Plan Comments:         Anesthesia Quick Evaluation

## 2017-09-30 NOTE — Anesthesia Postprocedure Evaluation (Signed)
Anesthesia Post Note  Patient: Victor Fowler  Procedure(s) Performed: COLONOSCOPY WITH PROPOFOL (N/A Rectum) ESOPHAGOGASTRODUODENOSCOPY (EGD) WITH PROPOFOL (N/A Throat)  Patient location during evaluation: PACU Anesthesia Type: General Level of consciousness: awake and alert Pain management: pain level controlled Vital Signs Assessment: post-procedure vital signs reviewed and stable Respiratory status: spontaneous breathing, nonlabored ventilation, respiratory function stable and patient connected to nasal cannula oxygen Cardiovascular status: blood pressure returned to baseline and stable Postop Assessment: no apparent nausea or vomiting Anesthetic complications: no    SCOURAS, NICOLE ELAINE

## 2017-09-30 NOTE — Transfer of Care (Signed)
Immediate Anesthesia Transfer of Care Note  Patient: Victor Fowler  Procedure(s) Performed: COLONOSCOPY WITH PROPOFOL (N/A Rectum) ESOPHAGOGASTRODUODENOSCOPY (EGD) WITH PROPOFOL (N/A Throat)  Patient Location: PACU  Anesthesia Type: General  Level of Consciousness: awake, alert  and patient cooperative  Airway and Oxygen Therapy: Patient Spontanous Breathing and Patient connected to supplemental oxygen  Post-op Assessment: Post-op Vital signs reviewed, Patient's Cardiovascular Status Stable, Respiratory Function Stable, Patent Airway and No signs of Nausea or vomiting  Post-op Vital Signs: Reviewed and stable  Complications: No apparent anesthesia complications

## 2017-09-30 NOTE — Anesthesia Procedure Notes (Signed)
Procedure Name: MAC Performed by: Waleed Dettman, CRNA Pre-anesthesia Checklist: Patient identified, Emergency Drugs available, Suction available, Patient being monitored and Timeout performed Patient Re-evaluated:Patient Re-evaluated prior to induction Oxygen Delivery Method: Nasal cannula Preoxygenation: Pre-oxygenation with 100% oxygen       

## 2017-09-30 NOTE — Op Note (Addendum)
[facility] Gastroenterology Patient Name: Victor SolomonsBradley Gauthier Procedure Date: 09/30/2017 9:35 AM MRN: [id number] Account #: [id number] Date of Birth: Dec 25, 1995 Admit Type: Outpatient Age: 22 [facility] Gender: Male Note Status: Supervisor Override Procedure:            Upper GI endoscopy Indications:          Epigastric abdominal pain, Abdominal pain in the right                        upper quadrant, Suspected gastro-esophageal reflux                        disease Providers:            Timberlynn Kizziah B. Maximino Greenlandahiliani MD, MD Referring MD:         Demetrios Isaacsonald E. Sherrie MustacheFisher, MD (Referring MD) Medicines:            Monitored Anesthesia Care Complications:        No immediate complications. Procedure:            Pre-Anesthesia Assessment:                       - The risks and benefits of the procedure and the                        sedation options and risks were discussed with the                        patient. All questions were answered and informed                        consent was obtained.                       - Patient identification and proposed procedure were                        verified prior to the procedure.                       - ASA Grade Assessment: II - A patient with mild                        systemic disease.                       After obtaining informed consent, the endoscope was                        passed under direct vision. Throughout the procedure,                        the patient's blood pressure, pulse, and oxygen                        saturations were monitored continuously. The Olympus                        [id number] Endoscope (S#. [id number]) was introduced  through the mouth, and advanced to the second part of                        duodenum. The upper GI endoscopy was accomplished with                        ease. The patient tolerated the procedure well. Findings:      The Z-line was irregular and was found 36  cm from the incisors.      One tongue of salmon-colored mucosa was present from 35 to 36 cm. The       maximum longitudinal extent of these esophageal mucosal changes was 1 cm       in length. Mucosa was biopsied with a cold forceps for histology in 4       quadrants. C0M1.      A small hiatal hernia was present.      Patchy mildly erythematous mucosa without bleeding was found in the       gastric antrum. Biopsies were taken with a cold forceps for histology.      The duodenal bulb and second portion of the duodenum were normal. Impression:           - Z-line irregular, 36 cm from the incisors.                       - Salmon-colored mucosa. Biopsied.                       - Small hiatal hernia.                       - Erythematous mucosa in the antrum. Biopsied.                       - Normal duodenal bulb and second portion of the                        duodenum. Recommendation:       - Await pathology results.                       - Continue present medications.                       - Follow an antireflux regimen.                       - Return to my office in 4 weeks.                       - The findings and recommendations were discussed with                        the patient.                       - The findings and recommendations were discussed with                        the patient's family. Procedure Code(s):    --- Professional ---  40981, Esophagogastroduodenoscopy, flexible, transoral;                        with biopsy, single or multiple Diagnosis Code(s):    --- Professional ---                       R10.13, Epigastric pain                       R10.11, Right upper quadrant pain                       K22.8, Other specified diseases of esophagus                       K31.89, Other diseases of stomach and duodenum                       K44.9, Diaphragmatic hernia without obstruction or                        gangrene CPT copyright 2018 American  Medical Association. All rights reserved. The codes documented in this report are preliminary and upon coder review may  be revised to meet current compliance requirements.  [name], MD Michel Bickers B. Maximino Greenland MD, MD 09/30/2017 10:14:39 AM This report has been signed electronically. Number of Addenda: 0 Note Initiated On: 09/30/2017 9:35 AM      [facility]

## 2017-10-02 ENCOUNTER — Encounter: Payer: Self-pay | Admitting: Gastroenterology

## 2017-10-06 ENCOUNTER — Telehealth: Payer: Self-pay

## 2017-10-06 NOTE — Telephone Encounter (Signed)
Pt notified of his procedure results. Pt stated he was still having some pain but this has improved. Pt has a follow up appt in February. This will be discussed at that time.

## 2017-10-06 NOTE — Telephone Encounter (Signed)
-----   Message from Pasty SpillersVarnita B Tahiliani, MD sent at 10/03/2017  4:29 PM EST ----- [name] please let patient know, his biopsies did not show any evidence of inflammatory bowel disease such as Crohn's or ulcerative colitis.Victor Fowler. His stomach biopsies showed intestinal metaplasia, which are changes in the stomach tissue that need to be monitored over time with repeat biopsies to look for any precancerous or dysplasia changes in the future.  This can be discussed further in clinic.  This finding does not explain his symptoms, however. If he is still having symptoms, please have him follow-up with me in clinic next available.  We would need to order a CT enterography or MR enterography to evaluate his small bowel completely.

## 2017-10-30 ENCOUNTER — Ambulatory Visit: Payer: Managed Care, Other (non HMO) | Admitting: Gastroenterology

## 2017-11-05 ENCOUNTER — Ambulatory Visit: Payer: Managed Care, Other (non HMO) | Admitting: Gastroenterology

## 2017-11-11 ENCOUNTER — Encounter: Payer: Self-pay | Admitting: Gastroenterology

## 2017-11-11 ENCOUNTER — Other Ambulatory Visit: Payer: Self-pay

## 2017-11-11 ENCOUNTER — Encounter (INDEPENDENT_AMBULATORY_CARE_PROVIDER_SITE_OTHER): Payer: Self-pay

## 2017-11-11 ENCOUNTER — Ambulatory Visit: Payer: Managed Care, Other (non HMO) | Admitting: Gastroenterology

## 2017-11-11 VITALS — BP 113/61 | HR 67 | Temp 98.4°F | Ht 70.0 in | Wt 175.4 lb

## 2017-11-11 DIAGNOSIS — K219 Gastro-esophageal reflux disease without esophagitis: Secondary | ICD-10-CM | POA: Diagnosis not present

## 2017-11-11 DIAGNOSIS — R109 Unspecified abdominal pain: Secondary | ICD-10-CM

## 2017-11-11 MED ORDER — OMEPRAZOLE 20 MG PO CPDR: 20.0000 mg | DELAYED_RELEASE_CAPSULE | Freq: Every day | ORAL | 0 refills | Status: DC

## 2017-11-11 NOTE — Progress Notes (Signed)
Melodie BouillonVarnita Milledge Gerding, MD [address]  [address]  [place], [place] [address]  Main: [phone]  Fax: [phone]   Primary Care Physician: Malva LimesFisher, Donald E, MD  Primary Gastroenterologist:  Dr. Melodie BouillonVarnita Stetson Pelaez  Chief Complaint  Patient presents with  . Follow-up    colonoscopy, EGD    HPI: Victor ShoemakerBradley P Fowler is a 22 y.o. male seen in initial consultation on September 03, 2017 for 7741-month history of intermittent abdominal pain.  Patient's pain has completely resolved.  He is eating a regular diet without any symptoms.  No heartburn, no dysphagia, no nausea vomiting.  One soft bowel movement daily.  No blood in stool.  He was prescribed omeprazole on last clinic visit, and is only taking it intermittently.  As per initial consult note.  "He had a CT abdomen with IV contrast on October 27 that showed no acute pathology.  It noted an unremarkable liver.  No biliary duct dilation.  Unremarkable gallbladder.  Unremarkable pancreas.  No bowel obstruction.  No acute inflammatory process.  Appendix not definitely visualized but no secondary signs of appendicitis reported.  Scattered colonic diverticulosis without evidence of diverticulitis.  No ascites.  Patient also underwent gallbladder ultrasound on November 2 that reported a normal gallbladder and normal bile duct with no biliary duct dilation.  The gallbladder was reported to be physiologically distended without sludge or gallstones.  No gallbladder wall thickening was reported.  The initial report reported a normal liver.  However, follow-up addendum stated that diffuse hepatic steatosis with parenchymal sparing was seen at the gallbladder fossa.  He underwent upper GI with small bowel follow-through on December 4 that showed normal appearance of the stomach, duodenum, jejunum.  However, he reported "some loss of the normal mucosal pattern of the jejunum with separation of bowel loops may reflect inflammatory change."   He had been  placed on a short course of steroids by primary care provider due to the upper GI follow-through findings.   This had initially improved his symptoms, however he called our office reporting abdominal pain,  His CRP was mildly elevated to 1.2 during his clinic visit with us, and an EGD and colonoscopy were ordered to rule out IBD.  Colonoscopy was done on September 30, 2017, and apart from mild rectal erythema, the rest of the exam was normal including a normal TI.  All colon and TI biopsies were normal. EGD was done on September 30, 2017, and showed c630m1, Barrett's.  Mild gastric erythema.  Biopsies showed inactive atrophic gastritis, with intestinal metaplasia.  Reflux type changes were noted at the GE junction.      Current Outpatient Medications  Medication Sig Dispense Refill  . omeprazole (PRILOSEC) 20 MG capsule Take 1 capsule (20 mg total) by mouth 2 (two) times daily before a meal. 60 capsule 1   No current facility-administered medications for this visit.     Allergies as of 11/11/2017  . (No Known Allergies)    ROS:  General: Negative for anorexia, weight loss, fever, chills, fatigue, weakness. ENT: Negative for hoarseness, difficulty swallowing , nasal congestion. CV: Negative for chest pain, angina, palpitations, dyspnea on exertion, peripheral edema.  Respiratory: Negative for dyspnea at rest, dyspnea on exertion, cough, sputum, wheezing.  GI: See history of present illness. GU:  Negative for dysuria, hematuria, urinary incontinence, urinary frequency, nocturnal urination.  Endo: Negative for unusual weight change.    Physical Examination:   BP 113/61   Pulse 67   Temp 98.4 F (36.9  C) (Oral)   Ht 5\' 10"  (1.778 m)   Wt 175 lb 6.4 oz (79.6 kg)   BMI 25.17 kg/m   General: Well-nourished, well-developed in no acute distress.  Eyes: No icterus. Conjunctivae pink. Mouth: Oropharyngeal mucosa moist and pink , no lesions erythema or exudate. Neck: Supple, Trachea  midline Abdomen: Bowel sounds are normal, nontender, nondistended, no hepatosplenomegaly or masses, no abdominal bruits or hernia , no rebound or guarding.   Extremities: No lower extremity edema. No clubbing or deformities. Neuro: Alert and oriented x 3.  Grossly intact. Skin: Warm and dry, no jaundice.   Psych: Alert and cooperative, normal mood and affect.   Labs: CMP     Component Value Date/Time   NA 138 09/03/2017 1328   NA 139 11/26/2012 1406   K 3.6 09/03/2017 1328   K 3.8 11/26/2012 1406   CL 105 09/03/2017 1328   CL 104 11/26/2012 1406   CO2 26 09/03/2017 1328   CO2 28 (H) 11/26/2012 1406   GLUCOSE 123 (H) 09/03/2017 1328   GLUCOSE 122 (H) 11/26/2012 1406   BUN 22 (H) 09/03/2017 1328   BUN 16 11/26/2012 1406   CREATININE 0.83 09/03/2017 1328   CREATININE 0.90 07/29/2017 1103   CALCIUM 9.3 09/03/2017 1328   CALCIUM 9.3 11/26/2012 1406   PROT 7.4 09/03/2017 1328   ALBUMIN 4.6 09/03/2017 1328   AST 23 09/03/2017 1328   ALT 35 09/03/2017 1328   ALKPHOS 71 09/03/2017 1328   BILITOT 0.7 09/03/2017 1328   GFRNONAA >60 09/03/2017 1328   GFRNONAA 121 07/29/2017 1103   GFRAA >60 09/03/2017 1328   GFRAA 140 07/29/2017 1103   Lab Results  Component Value Date   WBC 10.2 09/03/2017   HGB 16.1 09/03/2017   HCT 47.0 09/03/2017   MCV 92.3 09/03/2017   PLT 300 09/03/2017    Imaging Studies: No results found.  Assessment and Plan:   [name] is a 22 y.o. y/o male here for follow-up of abdominal pain which is resolved at this time  See HPI for recent colonoscopy and EGD We will repeat CRP to see if it is normalized Resolution of symptoms with omeprazole, is likely consistent with dyspepsia that responded to PPI  If no further symptoms, no indication for further investigation at this time If symptoms recur or CRP is elevated, will order MR or CT enterography to evaluate solid bowel Patient will call us right away if symptoms recur  I have encouraged him to  take Prilosec once a day only. 30 Minutes before breakfast.  This is due to the salmon colored mucosa seen at this GE junction.  He verbalized understanding.  Will eventually decrease the PPI further completely take it off depending on his symptoms and endoscopy findings  Given the intestinal metaplasia seen on his gastric biopsies, repeat EGD will be indicated in the future for gastric metaplasia biopsies  Follow up in 2-3 months  Dr [name]

## 2017-11-11 NOTE — Patient Instructions (Signed)
F/u 3 months 

## 2017-11-25 ENCOUNTER — Ambulatory Visit: Payer: Managed Care, Other (non HMO) | Admitting: Gastroenterology

## 2017-11-25 ENCOUNTER — Telehealth: Payer: Self-pay

## 2017-11-25 NOTE — Telephone Encounter (Signed)
Per S. (front desk) wife called, stating pt was in pain, black stools, and side hurting. Made pt an app for 2pm today but pt did not come nor call.

## 2017-12-16 ENCOUNTER — Telehealth: Payer: Self-pay | Admitting: Family Medicine

## 2017-12-16 NOTE — Telephone Encounter (Signed)
[facility] 11[facility]

## 2018-01-19 ENCOUNTER — Encounter: Payer: Self-pay | Admitting: Family Medicine

## 2018-01-19 ENCOUNTER — Ambulatory Visit (INDEPENDENT_AMBULATORY_CARE_PROVIDER_SITE_OTHER): Payer: Managed Care, Other (non HMO) | Admitting: Family Medicine

## 2018-01-19 DIAGNOSIS — Z23 Encounter for immunization: Secondary | ICD-10-CM

## 2018-01-19 NOTE — Progress Notes (Signed)
Vaccine administration only. No E&M service today.   

## 2018-01-27 ENCOUNTER — Telehealth: Payer: Self-pay | Admitting: Gastroenterology

## 2018-01-27 NOTE — Telephone Encounter (Signed)
PT wife would like a call from Dr. Maximino Greenland she states they received a bill $1000 for anethicia and she was not made aware that there would be a separate bill for that she is very upset and would like a call. She also mentioned that pt still has same symptoms and that procedure did not help

## 2018-02-16 ENCOUNTER — Ambulatory Visit: Payer: Managed Care, Other (non HMO) | Admitting: Gastroenterology

## 2018-02-18 ENCOUNTER — Ambulatory Visit: Payer: Managed Care, Other (non HMO) | Admitting: Gastroenterology

## 2018-02-18 ENCOUNTER — Encounter: Payer: Self-pay | Admitting: Gastroenterology

## 2018-03-10 ENCOUNTER — Ambulatory Visit: Payer: Managed Care, Other (non HMO) | Admitting: Gastroenterology

## 2018-03-11 ENCOUNTER — Telehealth: Payer: Self-pay | Admitting: Gastroenterology

## 2018-03-11 DIAGNOSIS — K219 Gastro-esophageal reflux disease without esophagitis: Secondary | ICD-10-CM

## 2018-03-11 MED ORDER — OMEPRAZOLE 20 MG PO CPDR: 20.0000 mg | DELAYED_RELEASE_CAPSULE | Freq: Every day | ORAL | 3 refills | Status: DC

## 2018-03-11 NOTE — Telephone Encounter (Signed)
Left message that Rx for omeprazole was sent to pharmacy.

## 2018-03-11 NOTE — Telephone Encounter (Signed)
Patient wife states pt ran out of medication omeprazole and would like [facility] [place]. Pt wife states right now they are tight on money but will come in for ov if neccessary. Patient wife requesting a call if this can not be done to see what they can buy over the counter.

## 2018-03-20 ENCOUNTER — Ambulatory Visit: Payer: Managed Care, Other (non HMO) | Admitting: Family Medicine

## 2018-03-20 ENCOUNTER — Encounter: Payer: Self-pay | Admitting: Family Medicine

## 2018-03-20 ENCOUNTER — Telehealth: Payer: Self-pay | Admitting: Family Medicine

## 2018-03-20 VITALS — BP 124/86 | HR 97 | Temp 98.9°F | Resp 16 | Wt 176.6 lb

## 2018-03-20 DIAGNOSIS — J0100 Acute maxillary sinusitis, unspecified: Secondary | ICD-10-CM | POA: Diagnosis not present

## 2018-03-20 MED ORDER — AMOXICILLIN-POT CLAVULANATE 875-125 MG PO TABS: 1.0000 | ORAL_TABLET | Freq: Two times a day (BID) | ORAL | 0 refills | Status: DC

## 2018-03-20 NOTE — Progress Notes (Signed)
  Subjective:     Patient ID: Victor Fowler, male   DOB: 02-15-1996, 22 y.o.   MRN: [id number] Chief Complaint  Patient presents with  . Sore Throat    Patient comes in office today with complaints of sore throat, cough, runny nose and right eart pain for one week.    HPI States his neck feels tight. Reports persistent greenish sinus drainage. Accompanied by his wife today who is not ill.  Review of Systems     Objective:   Physical Exam  Constitutional: He appears well-developed and well-nourished. No distress.  Ears: T.M's intact without inflammation Sinuses: mild maxillary sinus tenderness Throat: mild tonsillar enlargement without erythema or exudate Neck: Bilateral anterior cervical nodes. Lungs: clear     Assessment:    1. Acute maxillary sinusitis, recurrence not specified - amoxicillin-clavulanate (AUGMENTIN) 875-125 MG tablet; Take 1 tablet by mouth 2 (two) times daily.  Dispense: 20 tablet; Refill: 0    Plan:    Discussed use of Mucinex D.

## 2018-03-20 NOTE — Patient Instructions (Addendum)
Discussed use of Mucinex D for congestion. Let us know if not improving.

## 2018-03-27 ENCOUNTER — Ambulatory Visit: Payer: Managed Care, Other (non HMO) | Admitting: Family Medicine

## 2018-03-30 NOTE — Progress Notes (Signed)
       Patient: Victor Fowler Male    DOB: 10/28/1995   22 y.o.   MRN: [id number] Visit Date: 03/30/2018  Today's Provider: Mila Merryonald Fisher, MD   No chief complaint on file.  Subjective:    Sore Throat   Pertinent negatives include no abdominal pain, shortness of breath or vomiting.       Allergies  Allergen Reactions  . Watermelon Flavor Anaphylaxis     Current Outpatient Medications:  .  amoxicillin-clavulanate (AUGMENTIN) 875-125 MG tablet, Take 1 tablet by mouth 2 (two) times daily., Disp: 20 tablet, Rfl: 0 .  omeprazole (PRILOSEC) 20 MG capsule, Take 1 capsule (20 mg total) by mouth daily., Disp: 30 capsule, Rfl: 3  Review of Systems  Constitutional: Negative for appetite change, chills and fever.  HENT: Positive for sore throat.   Respiratory: Negative for chest tightness, shortness of breath and wheezing.   Cardiovascular: Negative for chest pain and palpitations.  Gastrointestinal: Negative for abdominal pain, nausea and vomiting.    Social History   Tobacco Use  . Smoking status: Never Smoker  . Smokeless tobacco: Never Used  Substance Use Topics  . Alcohol use: No   Objective:   There were no vitals taken for this visit. There were no vitals filed for this visit.   Physical Exam      Assessment & Plan:           Mila Merryonald Fisher, MD  [facility]

## 2018-03-31 ENCOUNTER — Ambulatory Visit: Payer: Managed Care, Other (non HMO) | Admitting: Family Medicine

## 2018-03-31 ENCOUNTER — Encounter: Payer: Self-pay | Admitting: Family Medicine

## 2018-03-31 VITALS — BP 120/68 | HR 80 | Temp 98.9°F | Resp 16 | Wt 177.0 lb

## 2018-03-31 DIAGNOSIS — R070 Pain in throat: Secondary | ICD-10-CM

## 2018-03-31 NOTE — Progress Notes (Signed)
       Patient: Victor ShoemakerBradley P Civello Male    DOB: [birth year]   22 y.o.   MRN: [id number] Visit Date: 03/31/2018  Today's Provider: Mila Merryonald Ameliya Nicotra, MD   Chief Complaint  Patient presents with  . Sore Throat   Subjective:    HPI  Acute maxillary sinusitis, recurrence not specified From 03/20/2018-seen by Toni ArthursBob Chauvin. Given rx for amoxicillin-clavulanate (AUGMENTIN) 875-125 MG tablet. Advised to use Mucinex  D for congestion. Today patient presents reporting that his throat is still sore. He states that it feels like his throat is swollen. Other symptoms include headache and swollen lymph glands in the neck. He says his throat also feels scratchy and dry. Patient has been taking Augmentin as prescribed. Patient denies any fevers, chills or sweats.  He denies heartburn, dysphagia, or persistent cough or fever.    Allergies  Allergen Reactions  . Watermelon Flavor Anaphylaxis     Current Outpatient Medications:  .  amoxicillin-clavulanate (AUGMENTIN) 875-125 MG tablet, Take 1 tablet by mouth 2 (two) times daily., Disp: 20 tablet, Rfl: 0 .  omeprazole (PRILOSEC) 20 MG capsule, Take 1 capsule (20 mg total) by mouth daily., Disp: 30 capsule, Rfl: 3  Review of Systems  Constitutional: Negative for appetite change, chills and fever.  HENT: Positive for sore throat.        Swelling of the thraot  Respiratory: Negative for chest tightness, shortness of breath and wheezing.   Cardiovascular: Negative for chest pain and palpitations.  Gastrointestinal: Negative for abdominal pain, nausea and vomiting.    Social History   Tobacco Use  . Smoking status: Never Smoker  . Smokeless tobacco: Never Used  Substance Use Topics  . Alcohol use: No   Objective:   BP 120/68 (BP Location: Left Arm, Patient Position: Sitting, Cuff Size: Large)   Pulse 80   Temp 98.9 F (37.2 C) (Oral)   Resp 16   Wt 177 lb (80.3 kg)   SpO2 98% Comment: room air  BMI 25.40 kg/m  Vitals:   03/31/18 1435  BP:  120/68  Pulse: 80  Resp: 16  Temp: 98.9 F (37.2 C)  TempSrc: Oral  SpO2: 98%  Weight: 177 lb (80.3 kg)     Physical Exam  General Appearance:    Alert, cooperative, no distress  HENT:   both TM normal without fluid or infection, neck without nodes, pharynx erythematous without exudate and sinuses nontender  Eyes:    PERRL, conjunctiva/corneas clear, EOM's intact       Lungs:     Clear to auscultation bilaterally, respirations unlabored  Heart:    Regular rate and rhythm         Assessment & Plan:     1. Throat pain Persistent for over two weeks with no improvement after taking Augmentin. Does not appear acutely ill.  - CBC - Monospot       Mila Merryonald Griffen Frayne, MD  [facility]

## 2018-03-31 NOTE — Patient Instructions (Signed)

## 2018-04-01 ENCOUNTER — Telehealth: Payer: Self-pay | Admitting: Family Medicine

## 2018-04-01 DIAGNOSIS — R070 Pain in throat: Secondary | ICD-10-CM

## 2018-04-01 LAB — CBC
Hematocrit: 43.1 % (ref 37.5–51.0)
Hemoglobin: 15.5 g/dL (ref 13.0–17.7)
MCH: 32.2 pg (ref 26.6–33.0)
MCHC: 36 g/dL — ABNORMAL HIGH (ref 31.5–35.7)
MCV: 90 fL (ref 79–97)
Platelets: 304 10*3/uL (ref 150–450)
RBC: 4.81 x10E6/uL (ref 4.14–5.80)
RDW: 13.1 % (ref 12.3–15.4)
WBC: 7.1 10*3/uL (ref 3.4–10.8)

## 2018-04-01 LAB — MONONUCLEOSIS SCREEN: Mono Screen: NEGATIVE

## 2018-04-01 NOTE — Telephone Encounter (Signed)
Patient's wife advised of results. Expressed understanding. Patient is no better and would like to proceed with referral to ENT. Referral in epic.

## 2018-04-01 NOTE — Telephone Encounter (Signed)
Please advise results? 

## 2018-04-01 NOTE — Telephone Encounter (Signed)
Labs were normal. No indication of infection. If not improving then recommend referral to ENT.

## 2018-04-01 NOTE — Telephone Encounter (Signed)
Pt's wife is calling requesting lab results for pt's test.   States she would like a call back around 130; that is when she takes lunch and wants to be able to speak with someone regarding them.

## 2018-04-01 NOTE — Telephone Encounter (Signed)
Pt's wife Rise Patiencedy called back asking for pt's results. Please advise. Thanks TNP

## 2018-04-01 NOTE — Telephone Encounter (Signed)
Pt's wife Edy called to see if she could get pt's results. She stated they have been trying to figure out what is going on with pt since October and they are both very concerned. Edy is requesting call back asap. Please advise. Thanks TNP

## 2020-09-05 ENCOUNTER — Telehealth: Payer: Self-pay

## 2020-09-05 NOTE — Telephone Encounter (Signed)
Copied from CRM [id number]. Topic: Appointment Scheduling - Scheduling Inquiry for Clinic >> Sep 05, 2020  8:10 AM Elliot Gault wrote: Reason for CRM: patient seeking appointment with PCP due to swollen knot on the right side of neck. Patient would like to discuss acid reflux. Patient states if PCP can not work him in okay to see another provider

## 2020-09-05 NOTE — Telephone Encounter (Signed)
Please schedule appointment.

## 2020-09-06 NOTE — Progress Notes (Signed)
Virtual telephone visit    Virtual Visit via Telephone Note   This visit type was conducted due to national recommendations for restrictions regarding the COVID-19 Pandemic (e.g. social distancing) in an effort to limit this patient's exposure and mitigate transmission in our community. Due to his co-morbid illnesses, this patient is at least at moderate risk for complications without adequate follow up. This format is felt to be most appropriate for this patient at this time. The patient did not have access to video technology or had technical difficulties with video requiring transitioning to audio format only (telephone). Physical exam was limited to content and character of the telephone converstion.    Parties involved in visit as below:   Patient location: at home  Provider location: Provider: Provider's office at  [facility], [place].     I discussed the limitations of evaluation and management by telemedicine and the availability of in person appointments. The patient expressed understanding and agreed to proceed.   Visit Date: 09/07/2020  Today's healthcare provider: Jairo Ben, FNP   Chief Complaint  Patient presents with  . Sore Throat   Subjective    Sore Throat  This is a new problem. The current episode started 1 to 4 weeks ago. The problem has been unchanged. There has been no fever. Associated symptoms include diarrhea, ear pain, headaches, a hoarse voice, neck pain and swollen glands. Pertinent negatives include no congestion, coughing, drooling, ear discharge, plugged ear sensation, shortness of breath, stridor, trouble swallowing or vomiting. He has had no exposure to strep or mono. He has tried nothing for the symptoms.   tested negative for covid this morning.  He says lymph node swelling on the right side of his neck, he did have ear pain and still has sore throat more on right side  for the last 2- 3 weeks. He has had  diarrhea for the past 2 days. diarrhea twice yesterday   He does have history of reflux has seen gastroenterology at [facility].Has not been taking prilosec as he feels it makes him nauseated and shakey appointment for 10/12/19 in February.  Denies any hemoptysis or any bloody stools or melena.   Patient  denies any fever, body aches,chills, rash, chest pain, shortness of breath, nausea, vomiting. Denies dizziness, lightheadedness, pre syncopal or syncopal episodes.   Patient Active Problem List   Diagnosis Date Noted  . Acute maxillary sinusitis 09/07/2020  . Ear pain, referred, right 09/07/2020  . Cervical lymphadenopathy 09/07/2020  . Gastroesophageal reflux disease 09/07/2020  . Gastrointestinal tract imaging abnormality   . Nausea, vomiting, and diarrhea   . Rectal inflammation   . Abdominal pain, epigastric   . Columnar epithelial-lined lower esophagus   . Stomach irritation   . Fatty liver 07/22/2017   Past Medical History:  Diagnosis Date  . Fatty liver   . Solitary kidney, congenital    Allergies  Allergen Reactions  . Watermelon Flavor Anaphylaxis      Medications: Outpatient Medications Prior to Visit  Medication Sig  . [DISCONTINUED] amoxicillin-clavulanate (AUGMENTIN) 875-125 MG tablet Take 1 tablet by mouth 2 (two) times daily.  . [DISCONTINUED] omeprazole (PRILOSEC) 20 MG capsule Take 1 capsule (20 mg total) by mouth daily.   No facility-administered medications prior to visit.    Review of Systems  HENT: Positive for ear pain and hoarse voice. Negative for congestion, drooling, ear discharge and trouble swallowing.   Respiratory: Negative for cough, shortness of breath and stridor.  Gastrointestinal: Positive for diarrhea. Negative for vomiting.  Musculoskeletal: Positive for neck pain.  Neurological: Positive for headaches.    Last CBC Lab Results  Component Value Date   WBC 7.1 03/31/2018   HGB 15.5 03/31/2018   HCT 43.1 03/31/2018   MCV 90  03/31/2018   MCH 32.2 03/31/2018   RDW 13.1 03/31/2018   PLT 304 03/31/2018   Last metabolic panel Lab Results  Component Value Date   GLUCOSE 123 (H) 09/03/2017   NA 138 09/03/2017   K 3.6 09/03/2017   CL 105 09/03/2017   CO2 26 09/03/2017   BUN 22 (H) 09/03/2017   CREATININE 0.83 09/03/2017   GFRNONAA >60 09/03/2017   GFRAA >60 09/03/2017   CALCIUM 9.3 09/03/2017   PROT 7.4 09/03/2017   ALBUMIN 4.6 09/03/2017   BILITOT 0.7 09/03/2017   ALKPHOS 71 09/03/2017   AST 23 09/03/2017   ALT 35 09/03/2017   ANIONGAP 7 09/03/2017      Objective    There were no vitals taken for this visit. BP Readings from Last 3 Encounters:  03/31/18 120/68  03/20/18 124/86  11/11/17 113/61   Wt Readings from Last 3 Encounters:  03/31/18 177 lb (80.3 kg)  03/20/18 176 lb 9.6 oz (80.1 kg)  11/11/17 175 lb 6.4 oz (79.6 kg)        Assessment & Plan     Pharyngitis, unspecified etiology  Acute maxillary sinusitis, recurrence not specified - Plan: amoxicillin-clavulanate (AUGMENTIN) 875-125 MG tablet  Ear pain, referred, right  Cervical lymphadenopathy  Gastroesophageal reflux disease, unspecified whether esophagitis present   Meds ordered this encounter  Medications  . amoxicillin-clavulanate (AUGMENTIN) 875-125 MG tablet    Sig: Take 1 tablet by mouth 2 (two) times daily.    Dispense:  20 tablet    Refill:  0  . pantoprazole (PROTONIX) 20 MG tablet    Sig: Take 1 tablet (20 mg total) by mouth daily.    Dispense:  30 tablet    Refill:  0   Medications Discontinued During This Encounter  Medication Reason  . omeprazole (PRILOSEC) 20 MG capsule Patient Preference  . amoxicillin-clavulanate (AUGMENTIN) 875-125 MG tablet Completed Course    Return in about 2 weeks (around 09/21/2020), or if symptoms worsen or fail to improve, for at any time for any worsening symptoms, Go to Emergency room/ urgent care if worse.   Red Flags discussed. The patient was given clear  instructions to go to ER or return to medical center if any red flags develop, symptoms do not improve, worsen or new problems develop. They verbalized understanding.  I discussed the assessment and treatment plan with the patient. The patient was provided an opportunity to ask questions and all were answered. The patient agreed with the plan and demonstrated an understanding of the instructions.   The patient was advised to call back or seek an in-person evaluation if the symptoms worsen or if the condition fails to improve as anticipated.  I provided 30 minutes of non-face-to-face time during this encounter.  The entirety of the information documented in the History of Present Illness, Review of Systems and Physical Exam were personally obtained by me. Portions of this information were initially documented by the CMA and reviewed by me for thoroughness and accuracy.     Jairo Ben, FNP [facility] [phone] (phone) [phone] (fax)  [facility]  Jairo Ben, FNP

## 2020-09-07 ENCOUNTER — Encounter: Payer: Self-pay | Admitting: Adult Health

## 2020-09-07 ENCOUNTER — Telehealth (INDEPENDENT_AMBULATORY_CARE_PROVIDER_SITE_OTHER): Payer: Managed Care, Other (non HMO) | Admitting: Adult Health

## 2020-09-07 DIAGNOSIS — J029 Acute pharyngitis, unspecified: Secondary | ICD-10-CM

## 2020-09-07 DIAGNOSIS — J0100 Acute maxillary sinusitis, unspecified: Secondary | ICD-10-CM | POA: Diagnosis not present

## 2020-09-07 DIAGNOSIS — H9201 Otalgia, right ear: Secondary | ICD-10-CM | POA: Insufficient documentation

## 2020-09-07 DIAGNOSIS — R590 Localized enlarged lymph nodes: Secondary | ICD-10-CM | POA: Insufficient documentation

## 2020-09-07 DIAGNOSIS — K219 Gastro-esophageal reflux disease without esophagitis: Secondary | ICD-10-CM

## 2020-09-07 MED ORDER — PANTOPRAZOLE SODIUM 20 MG PO TBEC: 20.0000 mg | DELAYED_RELEASE_TABLET | Freq: Every day | ORAL | 0 refills | Status: DC

## 2020-09-07 MED ORDER — AMOXICILLIN-POT CLAVULANATE 875-125 MG PO TABS: 1.0000 | ORAL_TABLET | Freq: Two times a day (BID) | ORAL | 0 refills | Status: DC

## 2020-09-07 NOTE — Patient Instructions (Addendum)
Gastroesophageal Reflux Disease, Adult Gastroesophageal reflux (GER) happens when acid from the stomach flows up into the tube that connects the mouth and the stomach (esophagus). Normally, food travels down the esophagus and stays in the stomach to be digested. With GER, food and stomach acid sometimes move back up into the esophagus. You may have a disease called gastroesophageal reflux disease (GERD) if the reflux:  Happens often.  Causes frequent or very bad symptoms.  Causes problems such as damage to the esophagus. When this happens, the esophagus becomes sore and swollen (inflamed). Over time, GERD can make small holes (ulcers) in the lining of the esophagus. What are the causes? This condition is caused by a problem with the muscle between the esophagus and the stomach. When this muscle is weak or not normal, it does not close properly to keep food and acid from coming back up from the stomach. The muscle can be weak because of:  Tobacco use.  Pregnancy.  Having a certain type of hernia (hiatal hernia).  Alcohol use.  Certain foods and drinks, such as coffee, chocolate, onions, and peppermint. What increases the risk? You are more likely to develop this condition if you:  Are overweight.  Have a disease that affects your connective tissue.  Use NSAID medicines. What are the signs or symptoms? Symptoms of this condition include:  Heartburn.  Difficult or painful swallowing.  The feeling of having a lump in the throat.  A bitter taste in the mouth.  Bad breath.  Having a lot of saliva.  Having an upset or bloated stomach.  Belching.  Chest pain. Different conditions can cause chest pain. Make sure you see your doctor if you have chest pain.  Shortness of breath or noisy breathing (wheezing).  Ongoing (chronic) cough or a cough at night.  Wearing away of the surface of teeth (tooth enamel).  Weight loss. How is this treated? Treatment will depend on how  bad your symptoms are. Your doctor may suggest:  Changes to your diet.  Medicine.  Surgery. Follow these instructions at home: Eating and drinking   Follow a diet as told by your doctor. You may need to avoid foods and drinks such as: ? Coffee and tea (with or without caffeine). ? Drinks that contain alcohol. ? Energy drinks and sports drinks. ? Bubbly (carbonated) drinks or sodas. ? Chocolate and cocoa. ? Peppermint and mint flavorings. ? Garlic and onions. ? Horseradish. ? Spicy and acidic foods. These include peppers, chili powder, curry powder, vinegar, hot sauces, and BBQ sauce. ? Citrus fruit juices and citrus fruits, such as oranges, lemons, and limes. ? Tomato-based foods. These include red sauce, chili, salsa, and pizza with red sauce. ? Fried and fatty foods. These include donuts, french fries, potato chips, and high-fat dressings. ? High-fat meats. These include hot dogs, rib eye steak, sausage, ham, and bacon. ? High-fat dairy items, such as whole milk, butter, and cream cheese.  Eat small meals often. Avoid eating large meals.  Avoid drinking large amounts of liquid with your meals.  Avoid eating meals during the 2-3 hours before bedtime.  Avoid lying down right after you eat.  Do not exercise right after you eat. Lifestyle   Do not use any products that contain nicotine or tobacco. These include cigarettes, e-cigarettes, and chewing tobacco. If you need help quitting, ask your doctor.  Try to lower your stress. If you need help doing this, ask your doctor.  If you are overweight, lose an amount   of weight that is healthy for you. Ask your doctor about a safe weight loss goal. General instructions  Pay attention to any changes in your symptoms.  Take over-the-counter and prescription medicines only as told by your doctor. Do not take aspirin, ibuprofen, or other NSAIDs unless your doctor says it is okay.  Wear loose clothes. Do not wear anything tight  around your waist.  Raise (elevate) the head of your bed about 6 inches (15 cm).  Avoid bending over if this makes your symptoms worse.  Keep all follow-up visits as told by your doctor. This is important. Contact a doctor if:  You have new symptoms.  You lose weight and you do not know why.  You have trouble swallowing or it hurts to swallow.  You have wheezing or a cough that keeps happening.  Your symptoms do not get better with treatment.  You have a hoarse voice. Get help right away if:  You have pain in your arms, neck, jaw, teeth, or back.  You feel sweaty, dizzy, or light-headed.  You have chest pain or shortness of breath.  You throw up (vomit) and your throw-up looks like blood or coffee grounds.  You pass out (faint).  Your poop (stool) is bloody or black.  You cannot swallow, drink, or eat. Summary  If a person has gastroesophageal reflux disease (GERD), food and stomach acid move back up into the esophagus and cause symptoms or problems such as damage to the esophagus.  Treatment will depend on how bad your symptoms are.  Follow a diet as told by your doctor.  Take all medicines only as told by your doctor. This information is not intended to replace advice given to you by your health care provider. Make sure you discuss any questions you have with your health care provider. Document Revised: 03/04/2018 Document Reviewed: 03/04/2018 Elsevier Patient Education  2020 [employer]  Food Choices for Gastroesophageal Reflux Disease, Adult When you have gastroesophageal reflux disease (GERD), the foods you eat and your eating habits are very important. Choosing the right foods can help ease your discomfort. Think about working with a nutrition specialist (dietitian) to help you make good choices. What are tips for following this plan?  Meals  Choose healthy foods that are low in fat, such as fruits, vegetables, whole grains, low-fat dairy products, and  lean meat, fish, and poultry.  Eat small meals often instead of 3 large meals a day. Eat your meals slowly, and in a place where you are relaxed. Avoid bending over or lying down until 2-3 hours after eating.  Avoid eating meals 2-3 hours before bed.  Avoid drinking a lot of liquid with meals.  Cook foods using methods other than frying. Bake, grill, or broil food instead.  Avoid or limit: ? Chocolate. ? Peppermint or spearmint. ? Alcohol. ? Pepper. ? Black and decaffeinated coffee. ? Black and decaffeinated tea. ? Bubbly (carbonated) soft drinks. ? Caffeinated energy drinks and soft drinks.  Limit high-fat foods such as: ? Fatty meat or fried foods. ? Whole milk, cream, butter, or ice cream. ? Nuts and nut butters. ? Pastries, donuts, and sweets made with butter or shortening.  Avoid foods that cause symptoms. These foods may be different for everyone. Common foods that cause symptoms include: ? Tomatoes. ? Oranges, lemons, and limes. ? Peppers. ? Spicy food. ? Onions and garlic. ? Vinegar. Lifestyle  Maintain a healthy weight. Ask your doctor what weight is healthy for you. If you   to lose weight, work with your doctor to do so safely.  Exercise for at least 30 minutes for 5 or more days each week, or as told by your doctor.  Wear loose-fitting clothes.  Do not smoke. If you need help quitting, ask your doctor.  Sleep with the head of your bed higher than your feet. Use a wedge under the mattress or blocks under the bed frame to raise the head of the bed. Summary  When you have gastroesophageal reflux disease (GERD), food and lifestyle choices are very important in easing your symptoms.  Eat small meals often instead of 3 large meals a day. Eat your meals slowly, and in a place where you are relaxed.  Limit high-fat foods such as fatty meat or fried foods.  Avoid bending over or lying down until 2-3 hours after eating.  Avoid peppermint and spearmint,  caffeine, alcohol, and chocolate. This information is not intended to replace advice given to you by your health care provider. Make sure you discuss any questions you have with your health care provider. Document Revised: 12/17/2018 Document Reviewed: 10/01/2016 Elsevier Patient Education  2020 [employer]. Earache, Adult An earache, or ear pain, can be caused by many things, including:  An infection.  Ear wax buildup.  Ear pressure.  Something in the ear that should not be there (foreign body).  A sore throat.  Tooth problems.  Jaw problems. Treatment of the earache will depend on the cause. If the cause is not clear or cannot be determined, you may need to watch your symptoms until your earache goes away or until a cause is found. Follow these instructions at home: Medicines  Take or apply over-the-counter and prescription medicines only as told by your health care provider.  If you were prescribed an antibiotic medicine, use it as told by your health care provider. Do not stop using the antibiotic even if you start to feel better.  Do not put anything in your ear other than medicine that is prescribed by your health care provider. Managing pain If directed, apply heat to the affected area as often as told by your health care provider. Use the heat source that your health care provider recommends, such as a moist heat pack or a heating pad.  Place a towel between your skin and the heat source.  Leave the heat on for 20-30 minutes.  Remove the heat if your skin turns bright red. This is especially important if you are unable to feel pain, heat, or cold. You may have a greater risk of getting burned. If directed, put ice on the affected area as often as told by your health care provider. To do this:      Put ice in a plastic bag.  Place a towel between your skin and the bag.  Leave the ice on for 20 minutes, 2-3 times a day. General instructions  Pay attention to  any changes in your symptoms.  Try resting in an upright position instead of lying down. This may help to reduce pressure in your ear and relieve pain.  Chew gum if it helps to relieve your ear pain.  Treat any allergies as told by your health care provider.  Drink enough fluid to keep your urine pale yellow.  It is up to you to get the results of any tests that were done. Ask your health care provider, or the department that is doing the tests, when your results will be ready.  Keep all  follow-up visits as told by your health care provider. This is important. Contact a health care provider if:  Your pain does not improve within 2 days.  Your earache gets worse.  You have new symptoms.  You have a fever. Get help right away if you:  Have a severe headache.  Have a stiff neck.  Have trouble swallowing.  Have redness or swelling behind your ear.  Have fluid or blood coming from your ear.  Have hearing loss.  Feel dizzy. Summary  An earache, or ear pain, can be caused by many things.  Treatment of the earache will depend on the cause. Follow recommendations from your health care provider to treat your ear pain.  If the cause is not clear or cannot be determined, you may need to watch your symptoms until your earache goes away or until a cause is found.  Keep all follow-up visits as told by your health care provider. This is important. This information is not intended to replace advice given to you by your health care provider. Make sure you discuss any questions you have with your health care provider. Document Revised: 04/03/2019 Document Reviewed: 04/03/2019 Elsevier Patient Education  2020 [employer] Inc. Sore Throat When you have a sore throat, your throat may feel:  Tender.  Burning.  Irritated.  Scratchy.  Painful when you swallow.  Painful when you talk. Many things can cause a sore throat, such as:  An infection.  Allergies.  Dry air.  Smoke  or pollution.  Radiation treatment.  Gastroesophageal reflux disease (GERD).  A tumor. A sore throat can be the first sign of another sickness. It can happen with other problems, like:  Coughing.  Sneezing.  Fever.  Swelling in the neck. Most sore throats go away without treatment. Follow these instructions at home:      Take over-the-counter medicines only as told by your doctor. ? If your child has a sore throat, do not give your child aspirin.  Drink enough fluids to keep your pee (urine) pale yellow.  Rest when you feel you need to.  To help with pain: ? Sip warm liquids, such as broth, herbal tea, or warm water. ? Eat or drink cold or frozen liquids, such as frozen ice pops. ? Gargle with a salt-water mixture 3-4 times a day or as needed. To make a salt-water mixture, add -1 tsp (3-6 g) of salt to 1 cup (237 mL) of warm water. Mix it until you cannot see the salt anymore. ? Suck on hard candy or throat lozenges. ? Put a cool-mist humidifier in your bedroom at night. ? Sit in the bathroom with the door closed for 5-10 minutes while you run hot water in the shower.  Do not use any products that contain nicotine or tobacco, such as cigarettes, e-cigarettes, and chewing tobacco. If you need help quitting, ask your doctor.  Wash your hands well and often with soap and water. If soap and water are not available, use hand sanitizer. Contact a doctor if:  You have a fever for more than 2-3 days.  You keep having symptoms for more than 2-3 days.  Your throat does not get better in 7 days.  You have a fever and your symptoms suddenly get worse.  Your child who is 3 months to 13 years old has a temperature of 102.27F (39C) or higher. Get help right away if:  You have trouble breathing.  You cannot swallow fluids, soft foods, or your saliva.  You have swelling in your throat or neck that gets worse.  You keep feeling sick to your stomach (nauseous).  You keep  throwing up (vomiting). Summary  A sore throat is pain, burning, irritation, or scratchiness in the throat. Many things can cause a sore throat.  Take over-the-counter medicines only as told by your doctor. Do not give your child aspirin.  Drink plenty of fluids, and rest as needed.  Contact a doctor if your symptoms get worse or your sore throat does not get better within 7 days. This information is not intended to replace advice given to you by your health care provider. Make sure you discuss any questions you have with your health care provider. Document Revised: 01/26/2018 Document Reviewed: 01/26/2018 Elsevier Patient Education  2020 Else[employer] Inc. Pharyngitis  Pharyngitis is a sore throat (pharynx). This is when there is redness, pain, and swelling in your throat. Most of the time, this condition gets better on its own. In some cases, you may need medicine. Follow these instructions at home:  Take over-the-counter and prescription medicines only as told by your doctor. ? If you were prescribed an antibiotic medicine, take it as told by your doctor. Do not stop taking the antibiotic even if you start to feel better. ? Do not give children aspirin. Aspirin has been linked to Reye syndrome.  Drink enough water and fluids to keep your pee (urine) clear or pale yellow.  Get a lot of rest.  Rinse your mouth (gargle) with a salt-water mixture 3-4 times a day or as needed. To make a salt-water mixture, completely dissolve -1 tsp of salt in 1 cup of warm water.  If your doctor approves, you may use throat lozenges or sprays to soothe your throat. Contact a doctor if:  You have large, tender lumps in your neck.  You have a rash.  You cough up green, yellow-brown, or bloody spit. Get help right away if:  You have a stiff neck.  You drool or cannot swallow liquids.  You cannot drink or take medicines without throwing up.  You have very bad pain that does not go away with  medicine.  You have problems breathing, and it is not from a stuffy nose.  You have new pain and swelling in your knees, ankles, wrists, or elbows. Summary  Pharyngitis is a sore throat (pharynx). This is when there is redness, pain, and swelling in your throat.  If you were prescribed an antibiotic medicine, take it as told by your doctor. Do not stop taking the antibiotic even if you start to feel better.  Most of the time, pharyngitis gets better on its own. Sometimes, you may need medicine. This information is not intended to replace advice given to you by your health care provider. Make sure you discuss any questions you have with your health care provider. Document Revised: 08/08/2017 Document Reviewed: 10/01/2016 Elsevier Patient Education  2020 Else[employer] Inc. Amoxicillin; Clavulanic Acid Tablets What is this medicine? AMOXICILLIN; CLAVULANIC ACID (a mox i SIL in; KLAV yoo lan ic AS id) is a penicillin antibiotic. It treats some infections caused by bacteria. It will not work for colds, the flu, or other viruses. This medicine may be used for other purposes; ask your health care provider or pharmacist if you have questions. COMMON BRAND NAME(S): Augmentin What should I tell my health care provider before I take this medicine? They need to know if you have any of these conditions:  bowel disease, like colitis  kidney disease  liver disease  mononucleosis  an unusual or allergic reaction to amoxicillin, penicillin, cephalosporin, other antibiotics, clavulanic acid, other medicines, foods, dyes, or preservatives  pregnant or trying to get pregnant  breast-feeding How should I use this medicine? Take this drug by mouth. Take it as directed on the prescription label at the same time every day. Take it with food at the start of a meal or snack. Take all of this drug unless your health care provider tells you to stop it early. Keep taking it even if you think you are  better. Talk to your health care provider about the use of this drug in children. While it may be prescribed for selected conditions, precautions do apply. Overdosage: If you think you have taken too much of this medicine contact a poison control center or emergency room at once. NOTE: This medicine is only for you. Do not share this medicine with others. What if I miss a dose? If you miss a dose, take it as soon as you can. If it is almost time for your next dose, take only that dose. Do not take double or extra doses. What may interact with this medicine?  allopurinol  anticoagulants  birth control pills  methotrexate  probenecid This list may not describe all possible interactions. Give your health care provider a list of all the medicines, herbs, non-prescription drugs, or dietary supplements you use. Also tell them if you smoke, drink alcohol, or use illegal drugs. Some items may interact with your medicine. What should I watch for while using this medicine? Tell your doctor or healthcare provider if your symptoms do not improve. This medicine may cause serious skin reactions. They can happen weeks to months after starting the medicine. Contact your healthcare provider right away if you notice fevers or flu-like symptoms with a rash. The rash may be red or purple and then turn into blisters or peeling of the skin. Or, you might notice a red rash with swelling of the face, lips or lymph nodes in your neck or under your arms. Do not treat diarrhea with over the counter products. Contact your doctor if you have diarrhea that lasts more than 2 days or if it is severe and watery. If you have diabetes, you may get a false-positive result for sugar in your urine. Check with your doctor or healthcare provider. Birth control pills may not work properly while you are taking this medicine. Talk to your doctor about using an extra method of birth control. What side effects may I notice from  receiving this medicine? Side effects that you should report to your doctor or health care professional as soon as possible:  allergic reactions like skin rash, itching or hives, swelling of the face, lips, or tongue  breathing problems  dark urine  fever or chills, sore throat  redness, blistering, peeling, or loosening of the skin, including inside the mouth  seizures  trouble passing urine or change in the amount of urine  unusual bleeding, bruising  unusually weak or tired  white patches or sores in the mouth or throat Side effects that usually do not require medical attention (report to your doctor or health care professional if they continue or are bothersome):  diarrhea  dizziness  headache  nausea, vomiting  stomach upset  vaginal or anal irritation This list may not describe all possible side effects. Call your doctor for medical advice about side effects. You may report side effects to FDA at  1-800-FDA-1088. Where should I keep my medicine? Keep out of the reach of children and pets. Store at room temperature between 20 and 25 degrees C (68 and 77 degrees F). Throw away any unused drug after the expiration date. NOTE: This sheet is a summary. It may not cover all possible information. If you have questions about this medicine, talk to your doctor, pharmacist, or health care provider.  2020 Elsevier/Gold Standard (2019-03-29 11:55:53) Pantoprazole tablets What is this medicine? PANTOPRAZOLE (pan TOE pra zole) prevents the production of acid in the stomach. It is used to treat gastroesophageal reflux disease (GERD), inflammation of the esophagus, and Zollinger-Ellison syndrome. This medicine may be used for other purposes; ask your health care provider or pharmacist if you have questions. COMMON BRAND NAME(S): Protonix What should I tell my health care provider before I take this medicine? They need to know if you have any of these conditions:  liver  disease  low levels of magnesium in the blood  lupus  an unusual or allergic reaction to omeprazole, lansoprazole, pantoprazole, rabeprazole, other medicines, foods, dyes, or preservatives  pregnant or trying to get pregnant  breast-feeding How should I use this medicine? Take this medicine by mouth. Swallow the tablets whole with a drink of water. Follow the directions on the prescription label. Do not crush, break, or chew. Take your medicine at regular intervals. Do not take your medicine more often than directed. Talk to your pediatrician regarding the use of this medicine in children. While this drug may be prescribed for children as young as 5 years for selected conditions, precautions do apply. Overdosage: If you think you have taken too much of this medicine contact a poison control center or emergency room at once. NOTE: This medicine is only for you. Do not share this medicine with others. What if I miss a dose? If you miss a dose, take it as soon as you can. If it is almost time for your next dose, take only that dose. Do not take double or extra doses. What may interact with this medicine? Do not take this medicine with any of the following medications:  atazanavir  nelfinavir This medicine may also interact with the following medications:  ampicillin  delavirdine  erlotinib  iron salts  medicines for fungal infections like ketoconazole, itraconazole and voriconazole  methotrexate  mycophenolate mofetil  warfarin This list may not describe all possible interactions. Give your health care provider a list of all the medicines, herbs, non-prescription drugs, or dietary supplements you use. Also tell them if you smoke, drink alcohol, or use illegal drugs. Some items may interact with your medicine. What should I watch for while using this medicine? It can take several days before your stomach pain gets better. Check with your doctor or health care provider if your  condition does not start to get better, or if it gets worse. This medicine may cause serious skin reactions. They can happen weeks to months after starting the medicine. Contact your health care provider right away if you notice fevers or flu-like symptoms with a rash. The rash may be red or purple and then turn into blisters or peeling of the skin. Or, you might notice a red rash with swelling of the face, lips or lymph nodes in your neck or under your arms. You may need blood work done while you are taking this medicine. This medicine may cause a decrease in vitamin B12. You should make sure that you get enough vitamin B12  while you are taking this medicine. Discuss the foods you eat and the vitamins you take with your health care provider. What side effects may I notice from receiving this medicine? Side effects that you should report to your doctor or health care professional as soon as possible:   allergic reactions like skin rash, itching or hives, swelling of the face, lips, or tongue   bone, muscle or joint pain   breathing problems   chest pain or chest tightness   dark yellow or brown urine   dizziness   fast, irregular heartbeat   feeling faint or lightheaded   fever or sore throat   muscle spasm   palpitations   rash on cheeks or arms that gets worse in the sun   redness, blistering, peeling or loosening of the skin, including inside the mouth   seizures  stomach polyps   tremors   unusual bleeding or bruising   unusually weak or tired   yellowing of the eyes or skin Side effects that usually do not require medical attention (report to your doctor or health care professional if they continue or are bothersome):   constipation   diarrhea   dry mouth   headache   nausea This list may not describe all possible side effects. Call your doctor for medical advice about side effects. You may report side effects to FDA at  1-800-FDA-1088. Where should I keep my medicine? Keep out of the reach of children. Store at room temperature between 15 and 30 degrees C (59 and 86 degrees F). Protect from light and moisture. Throw away any unused medicine after the expiration date. NOTE: This sheet is a summary. It may not cover all possible information. If you have questions about this medicine, talk to your doctor, pharmacist, or health care provider.  2020 Elsevier/Gold Standard (2018-12-04 13:18:32)

## 2020-09-18 ENCOUNTER — Telehealth: Payer: Self-pay | Admitting: Family Medicine

## 2020-09-18 DIAGNOSIS — J312 Chronic pharyngitis: Secondary | ICD-10-CM

## 2020-09-18 NOTE — Telephone Encounter (Signed)
Patient was seen 09/07/20 for a sore throat and was given antibiotics and was seen virtually. Patient still has had sore throat for a month. And is now have blood out his nose in the mornings. Requesting approval to be seen in office. Please advise CB- [phone]

## 2020-09-18 NOTE — Telephone Encounter (Signed)
Recommend ENT referral

## 2020-09-19 NOTE — Telephone Encounter (Signed)
Advised patient as below. Patient is willing to see ENT. Order placed.

## 2020-09-29 ENCOUNTER — Other Ambulatory Visit: Payer: Self-pay | Admitting: Adult Health

## 2020-09-29 NOTE — Telephone Encounter (Signed)
Requested medications are due for refill today yes  Requested medications are on the active medication list yes  Last refill 12/30  Last visit 12/30  Future visit scheduled no  Notes to clinic OV 12/30 states to come back in two weeks to assess gastritis.

## 2020-10-11 ENCOUNTER — Ambulatory Visit: Payer: Managed Care, Other (non HMO) | Admitting: Gastroenterology

## 2020-10-11 ENCOUNTER — Encounter: Payer: Self-pay | Admitting: Gastroenterology

## 2020-10-11 ENCOUNTER — Other Ambulatory Visit: Payer: Self-pay

## 2020-10-11 VITALS — BP 144/82 | HR 93 | Temp 98.6°F | Wt 194.0 lb

## 2020-10-11 DIAGNOSIS — R1013 Epigastric pain: Secondary | ICD-10-CM

## 2020-10-11 DIAGNOSIS — K31A0 Gastric intestinal metaplasia, unspecified: Secondary | ICD-10-CM | POA: Diagnosis not present

## 2020-10-11 MED ORDER — FAMOTIDINE 20 MG PO TABS: 20.0000 mg | ORAL_TABLET | Freq: Every day | ORAL | 1 refills | Status: DC

## 2020-10-11 NOTE — Patient Instructions (Signed)
Please remember to purchase a wedge pillow to help you with you acid reflux.   Food Choices for Gastroesophageal Reflux Disease, Adult When you have gastroesophageal reflux disease (GERD), the foods you eat and your eating habits are very important. Choosing the right foods can help ease your discomfort. Think about working with a food expert (dietitian) to help you make good choices. What are tips for following this plan? Reading food labels  Look for foods that are low in saturated fat. Foods that may help with your symptoms include: ? Foods that have less than 5% of daily value (DV) of fat. ? Foods that have 0 grams of trans fat. Cooking  Do not fry your food.  Cook your food by baking, steaming, grilling, or broiling. These are all methods that do not need a lot of fat for cooking.  To add flavor, try to use herbs that are low in spice and acidity. Meal planning  Choose healthy foods that are low in fat, such as: ? Fruits and vegetables. ? Whole grains. ? Low-fat dairy products. ? Lean meats, fish, and poultry.  Eat small meals often instead of eating 3 large meals each day. Eat your meals slowly in a place where you are relaxed. Avoid bending over or lying down until 2-3 hours after eating.  Limit high-fat foods such as fatty meats or fried foods.  Limit your intake of fatty foods, such as oils, butter, and shortening.  Avoid the following as told by your doctor: ? Foods that cause symptoms. These may be different for different people. Keep a food diary to keep track of foods that cause symptoms. ? Alcohol. ? Drinking a lot of liquid with meals. ? Eating meals during the 2-3 hours before bed.   Lifestyle  Stay at a healthy weight. Ask your doctor what weight is healthy for you. If you need to lose weight, work with your doctor to do so safely.  Exercise for at least 30 minutes on 5 or more days each week, or as told by your doctor.  Wear loose-fitting clothes.  Do  not smoke or use any products that contain nicotine or tobacco. If you need help quitting, ask your doctor.  Sleep with the head of your bed higher than your feet. Use a wedge under the mattress or blocks under the bed frame to raise the head of the bed.  Chew sugar-free gum after meals. What foods should eat? Eat a healthy, well-balanced diet of fruits, vegetables, whole grains, low-fat dairy products, lean meats, fish, and poultry. Each person is different. Foods that may cause symptoms in one person may not cause any symptoms in another person. Work with your doctor to find foods that are safe for you. The items listed above may not be a complete list of what you can eat and drink. Contact a food expert for more options.   What foods should I avoid? Limiting some of these foods may help in managing the symptoms of GERD. Everyone is different. Talk with a food expert or your doctor to help you find the exact foods to avoid, if any. Fruits Any fruits prepared with added fat. Any fruits that cause symptoms. For some people, this may include citrus fruits, such as oranges, grapefruit, pineapple, and lemons. Vegetables Deep-fried vegetables. [place] fries. Any vegetables prepared with added fat. Any vegetables that cause symptoms. For some people, this may include tomatoes and tomato products, chili peppers, onions and garlic, and horseradish. Grains Pastries or quick  breads with added fat. Meats and other proteins High-fat meats, such as fatty beef or pork, hot dogs, ribs, ham, sausage, salami, and bacon. Fried meat or protein, including fried fish and fried chicken. Nuts and nut butters, in large amounts. Dairy Whole milk and chocolate milk. Sour cream. Cream. Ice cream. Cream cheese. Milkshakes. Fats and oils Butter. Margarine. Shortening. Ghee. Beverages Coffee and tea, with or without caffeine. Carbonated beverages. Sodas. Energy drinks. Fruit juice made with acidic fruits, such as orange  or grapefruit. Tomato juice. Alcoholic drinks. Sweets and desserts Chocolate and cocoa. Donuts. Seasonings and condiments Pepper. Peppermint and spearmint. Added salt. Any condiments, herbs, or seasonings that cause symptoms. For some people, this may include curry, hot sauce, or vinegar-based salad dressings. The items listed above may not be a complete list of what you should not eat and drink. Contact a food expert for more options. Questions to ask your doctor Diet and lifestyle changes are often the first steps that are taken to manage symptoms of GERD. If diet and lifestyle changes do not help, talk with your doctor about taking medicines. Where to find more information  International Foundation for Gastrointestinal Disorders: aboutgerd.org Summary  When you have GERD, food and lifestyle choices are very important in easing your symptoms.  Eat small meals often instead of 3 large meals a day. Eat your meals slowly and in a place where you are relaxed.  Avoid bending over or lying down until 2-3 hours after eating.  Limit high-fat foods such as fatty meats or fried foods. This information is not intended to replace advice given to you by your health care provider. Make sure you discuss any questions you have with your health care provider. Document Revised: 03/06/2020 Document Reviewed: 03/06/2020 Elsevier Patient Education  2021 [employer].

## 2020-10-12 ENCOUNTER — Emergency Department: Payer: Managed Care, Other (non HMO)

## 2020-10-12 ENCOUNTER — Other Ambulatory Visit: Payer: Self-pay

## 2020-10-12 ENCOUNTER — Emergency Department
Admission: EM | Admit: 2020-10-12 | Discharge: 2020-10-12 | Disposition: A | Discharge status: Home / Self Care | Payer: Managed Care, Other (non HMO) | Attending: Emergency Medicine | Admitting: Emergency Medicine

## 2020-10-12 DIAGNOSIS — R0602 Shortness of breath: Secondary | ICD-10-CM | POA: Diagnosis not present

## 2020-10-12 DIAGNOSIS — R079 Chest pain, unspecified: Secondary | ICD-10-CM | POA: Insufficient documentation

## 2020-10-12 DIAGNOSIS — R000 Tachycardia, unspecified: Secondary | ICD-10-CM | POA: Insufficient documentation

## 2020-10-12 DIAGNOSIS — R55 Syncope and collapse: Secondary | ICD-10-CM | POA: Diagnosis not present

## 2020-10-12 DIAGNOSIS — R42 Dizziness and giddiness: Secondary | ICD-10-CM

## 2020-10-12 LAB — URINALYSIS, COMPLETE (UACMP) WITH MICROSCOPIC
Bacteria, UA: NONE SEEN
Bilirubin Urine: NEGATIVE
Glucose, UA: NEGATIVE mg/dL
Hgb urine dipstick: NEGATIVE
Ketones, ur: 15 mg/dL — AB
Leukocytes,Ua: NEGATIVE
Nitrite: NEGATIVE
Protein, ur: NEGATIVE mg/dL
Specific Gravity, Urine: >1.03 — ABNORMAL HIGH (ref 1.005–1.030)
pH: 6.5 (ref 5.0–8.0)

## 2020-10-12 LAB — CBC
HCT: 45.9 % (ref 39.0–52.0)
Hemoglobin: 15.8 g/dL (ref 13.0–17.0)
MCH: 31.4 pg (ref 26.0–34.0)
MCHC: 34.4 g/dL (ref 30.0–36.0)
MCV: 91.3 fL (ref 80.0–100.0)
Platelets: 379 10*3/uL (ref 150–400)
RBC: 5.03 MIL/uL (ref 4.22–5.81)
RDW: 12.2 % (ref 11.5–15.5)
WBC: 9.7 10*3/uL (ref 4.0–10.5)
nRBC: 0 % (ref 0.0–0.2)

## 2020-10-12 LAB — TROPONIN I (HIGH SENSITIVITY)
Troponin I (High Sensitivity): 3 ng/L (ref <18)
Troponin I (High Sensitivity): 3 ng/L (ref <18)

## 2020-10-12 LAB — BRAIN NATRIURETIC PEPTIDE: B Natriuretic Peptide: 19 pg/mL (ref 0.0–100.0)

## 2020-10-12 LAB — FIBRIN DERIVATIVES D-DIMER (ARMC ONLY): Fibrin derivatives D-dimer (ARMC): 251.79 ng/mL (FEU) (ref 0.00–499.00)

## 2020-10-12 LAB — BASIC METABOLIC PANEL
Anion gap: 12 (ref 5–15)
BUN: 16 mg/dL (ref 6–20)
CO2: 22 mmol/L (ref 22–32)
Calcium: 9.7 mg/dL (ref 8.9–10.3)
Chloride: 104 mmol/L (ref 98–111)
Creatinine, Ser: 0.96 mg/dL (ref 0.61–1.24)
GFR, Estimated: >60 mL/min (ref >60)
Glucose, Bld: 116 mg/dL — ABNORMAL HIGH (ref 70–99)
Potassium: 3.7 mmol/L (ref 3.5–5.1)
Sodium: 138 mmol/L (ref 135–145)

## 2020-10-12 IMAGING — CR DG CHEST 2V
2 series · 2 of 2 positions shown · non-contrast
Comparison: None.

CLINICAL DATA: Shortness of breath chest pain for several days

EXAM:
CHEST - 2 VIEW

[chest pa]
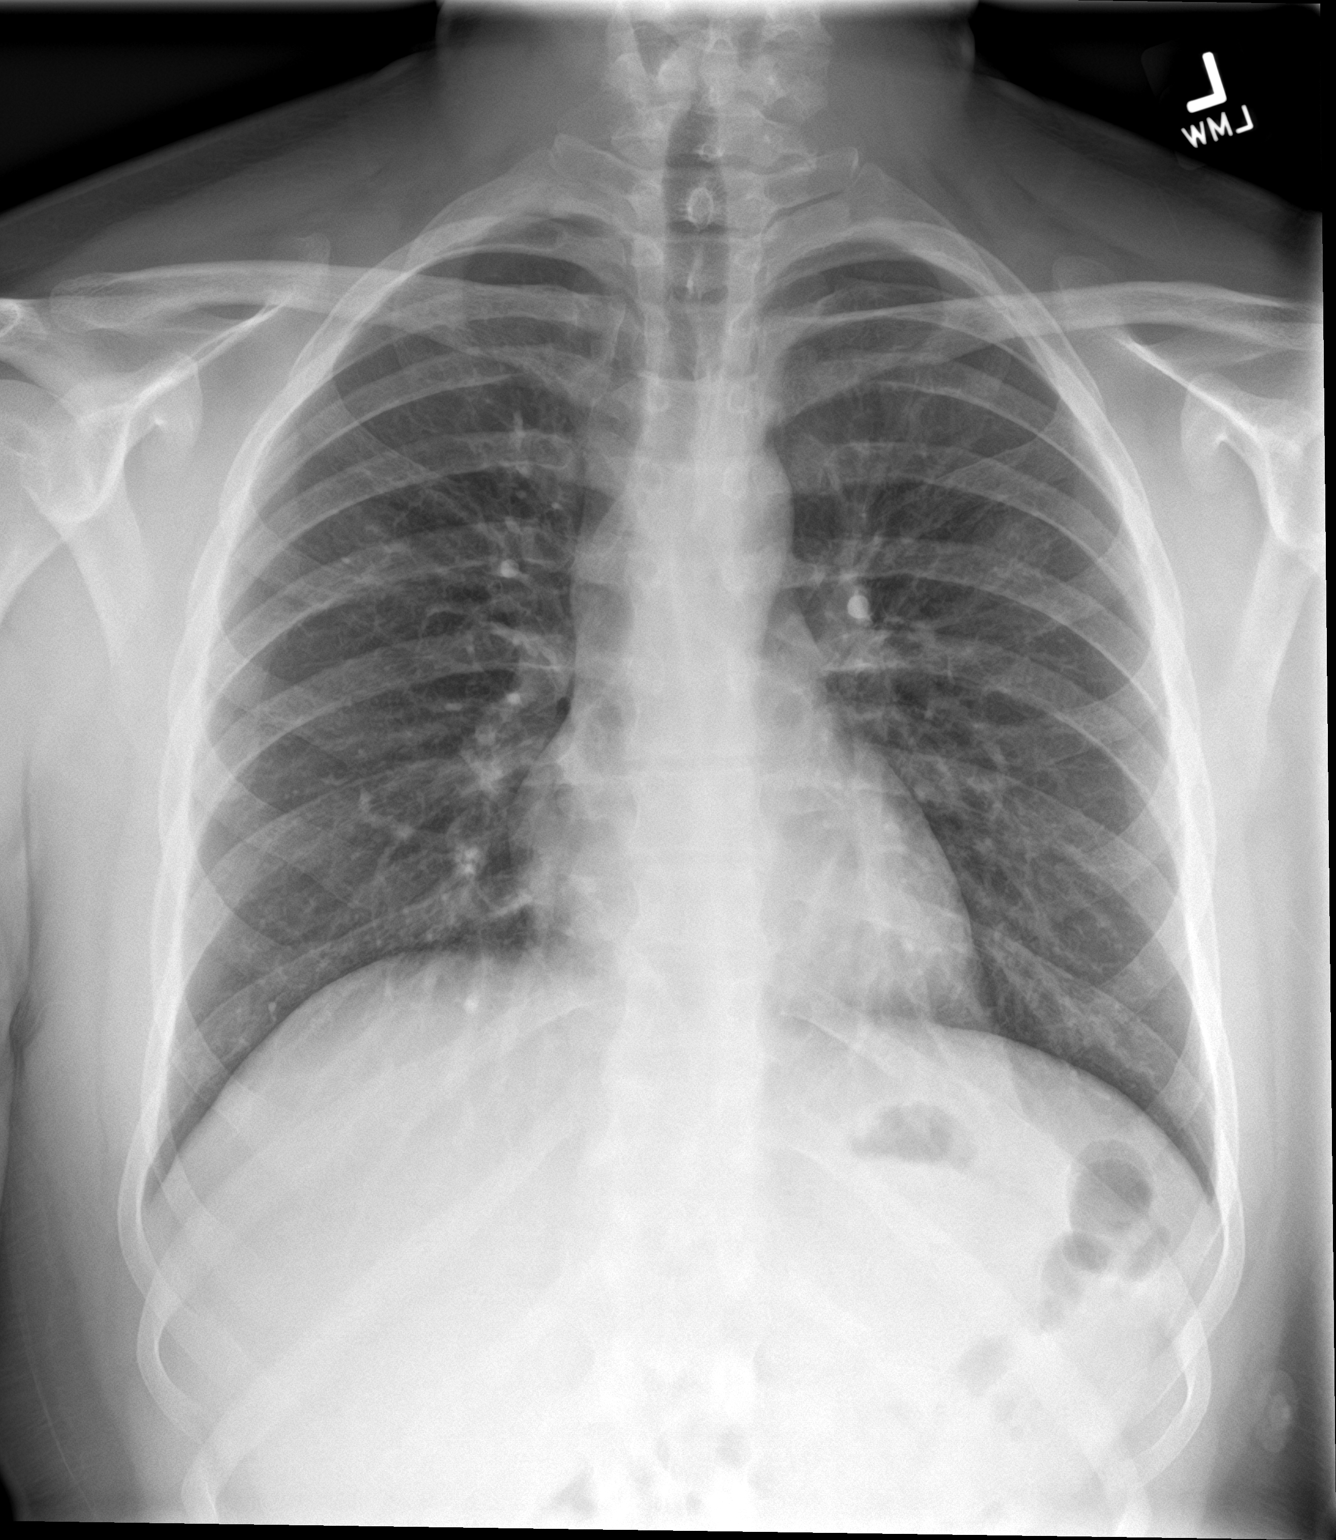

[chest lat]
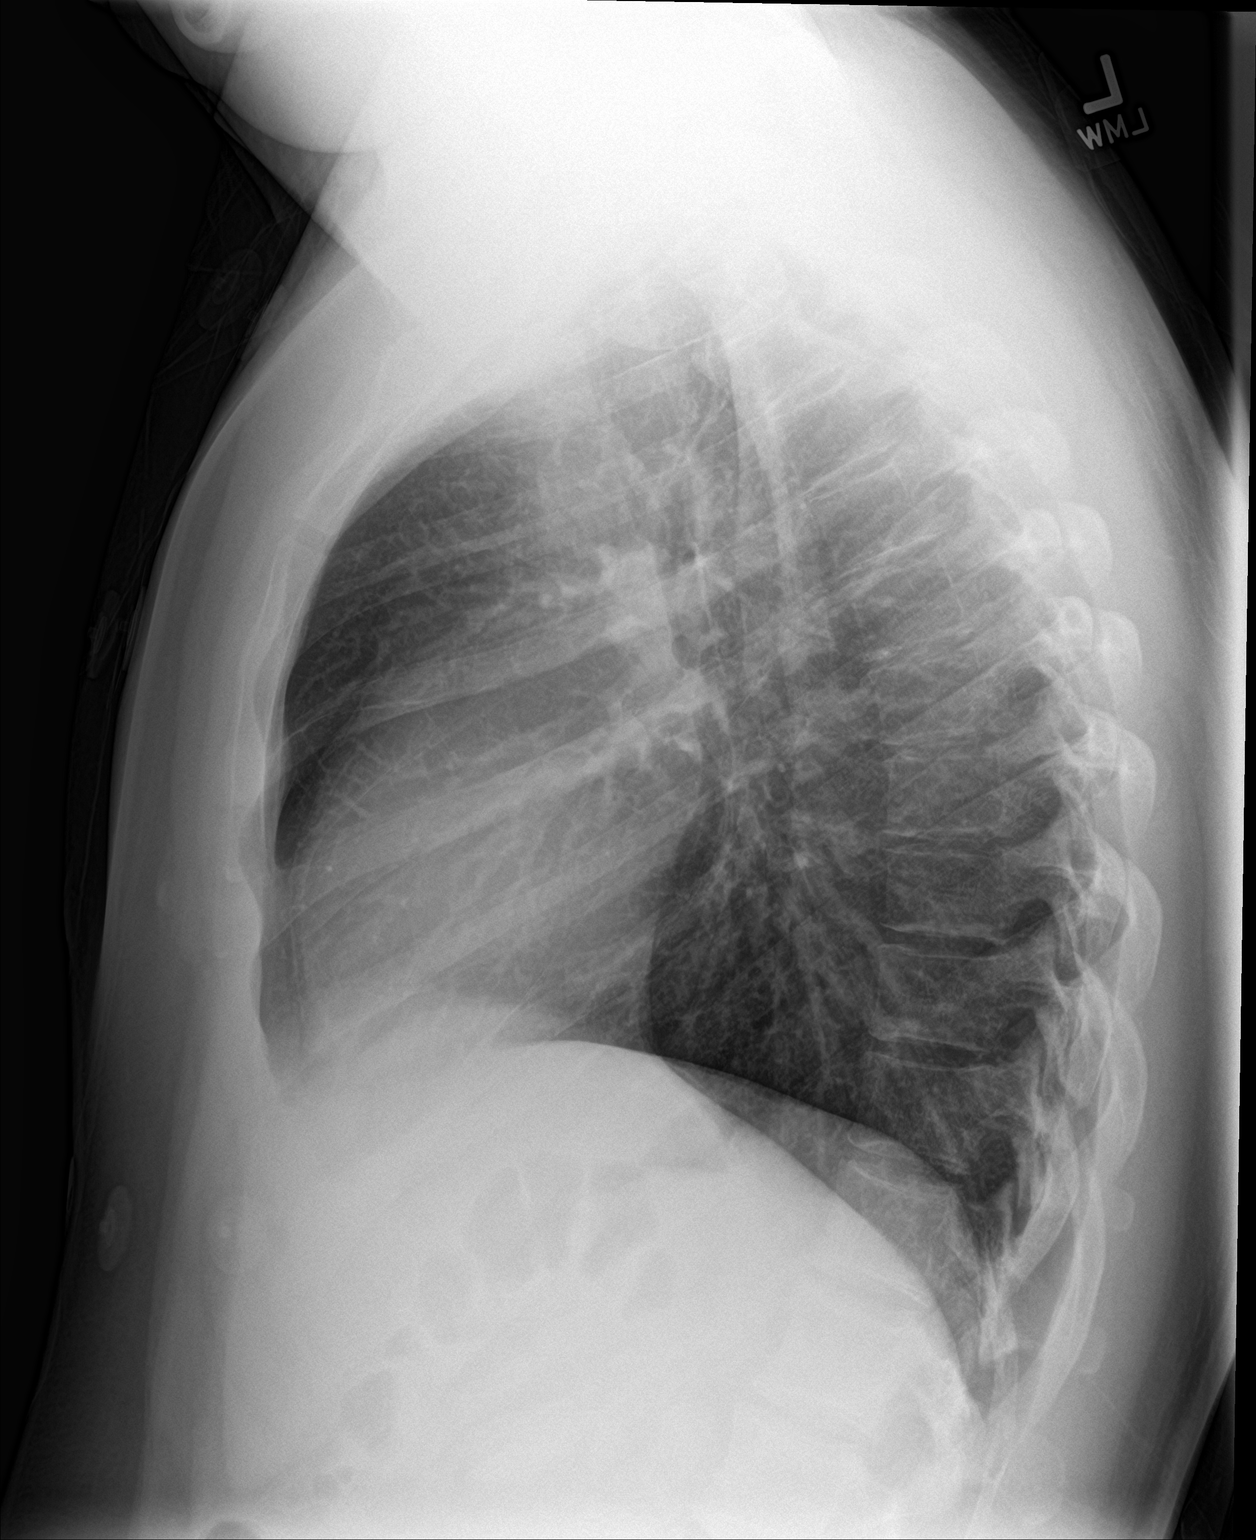

[2 of 2 positions shown; findings below may reference images not displayed]

FINDINGS: The heart size and mediastinal contours are within normal limits.
Both lungs are clear. The visualized skeletal structures are
unremarkable.
IMPRESSION: No active cardiopulmonary disease.

## 2020-10-12 IMAGING — CT CT HEAD W/O CM
3 series · 15 of 47 positions shown, 18 images · non-contrast
Comparison: Head CT dated [DATE]

CLINICAL DATA: 24-year-old male with headache.

EXAM:
CT HEAD WITHOUT CONTRAST
TECHNIQUE: Contiguous axial images were obtained from the base of the skull
through the vertex without intravenous contrast.

[Series 2: head wo · axial · 0.44mm/px · z∈[-104,+21]mm · 9 of 30 slices shown, 12 images]
[im 3/30  brain]
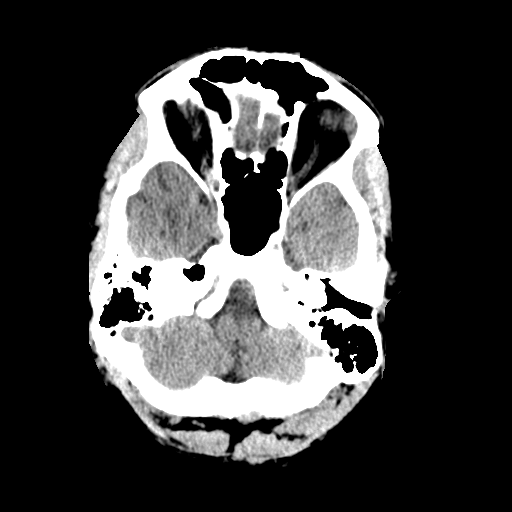
[im 3/30  bone]
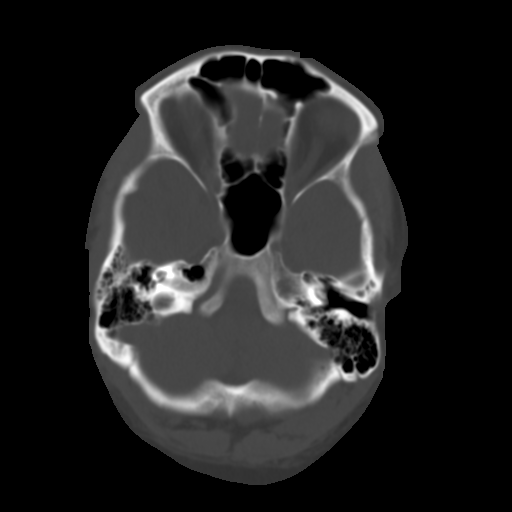
[im 6/30  brain]
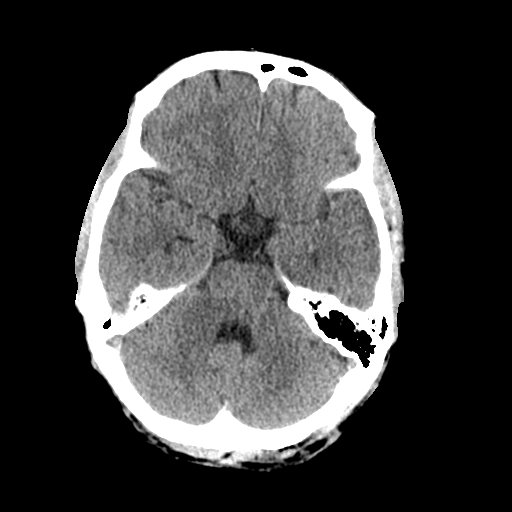
[im 9/30  brain]
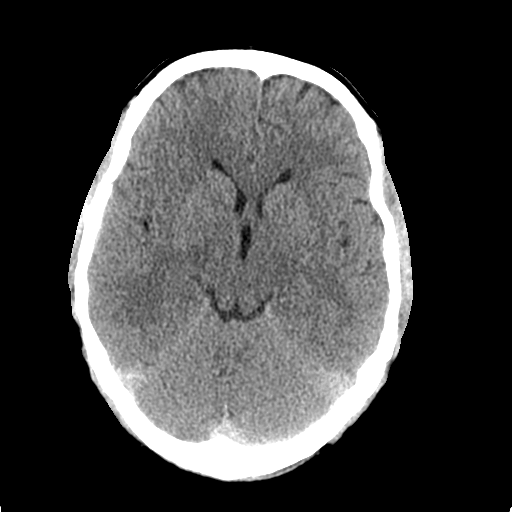
[im 12/30  brain]
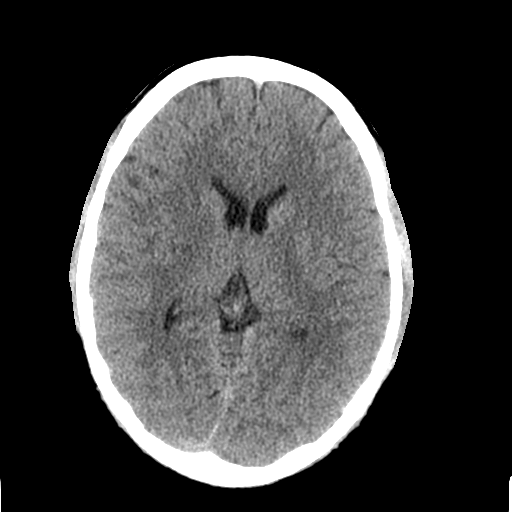
[im 16/30  brain]
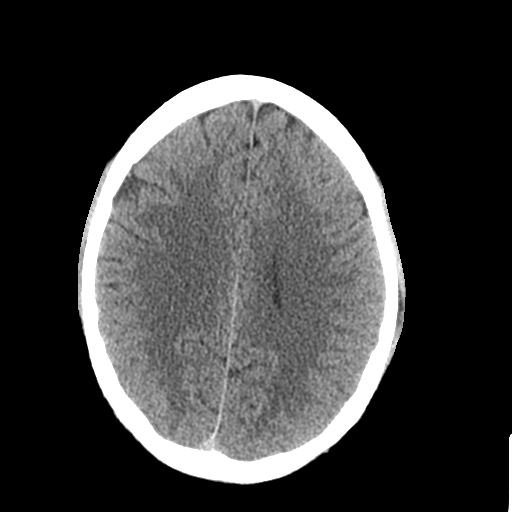
[im 16/30  bone]
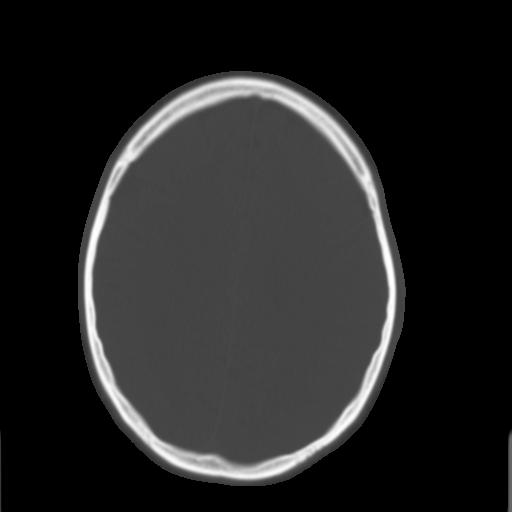
[im 19/30  brain]
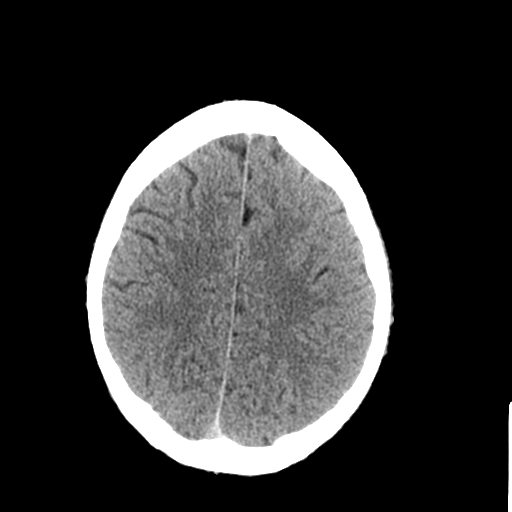
[im 22/30  brain]
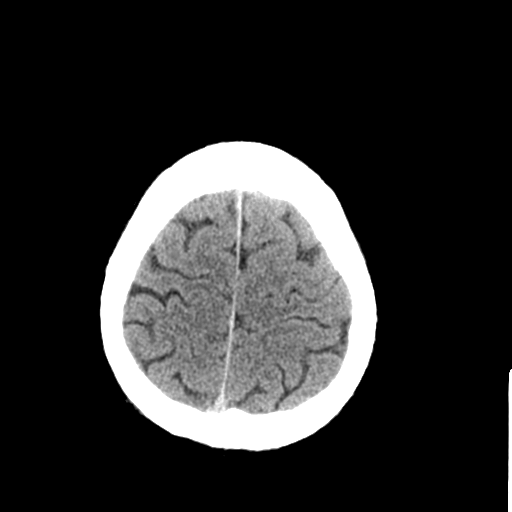
[im 25/30  brain]
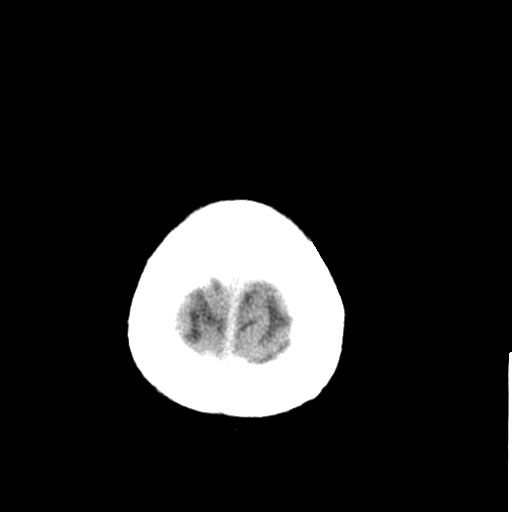
[im 28/30  brain]
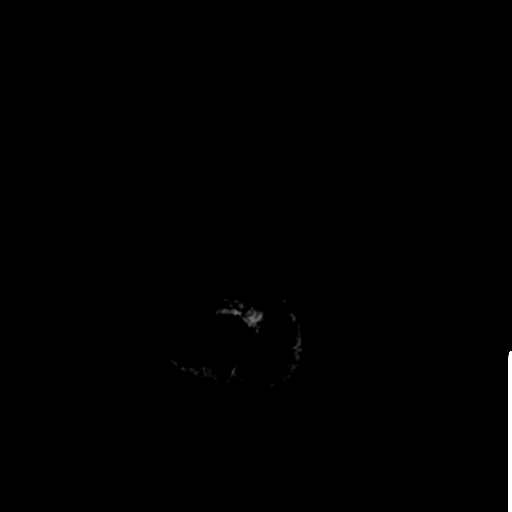
[im 28/30  bone]
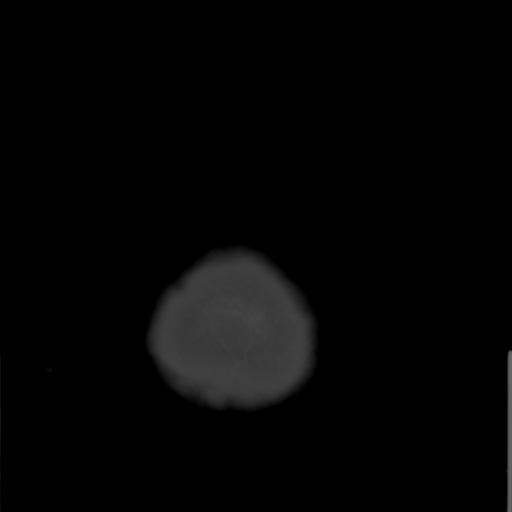

[Series 4: coronal soft tissue · coronal · 0.30mm/px · 3 of 67 slices shown]
[im 23/67  brain]
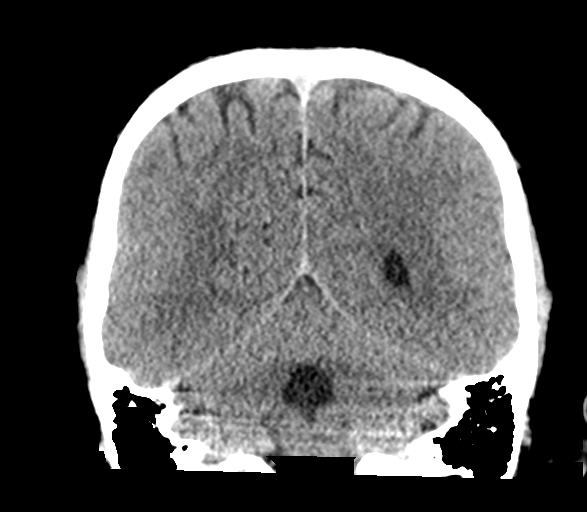
[im 30/67  brain]
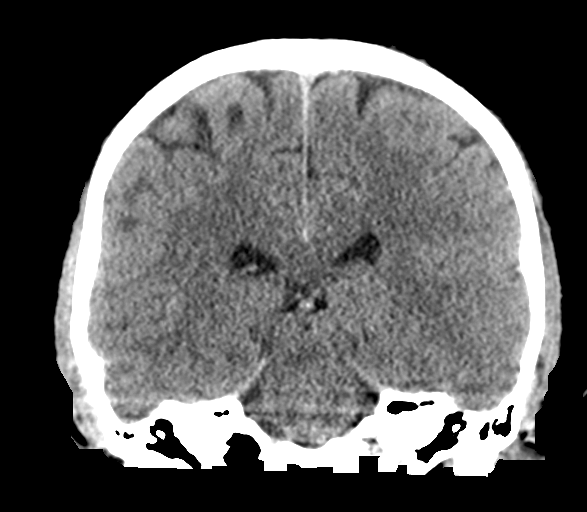
[im 37/67  brain]
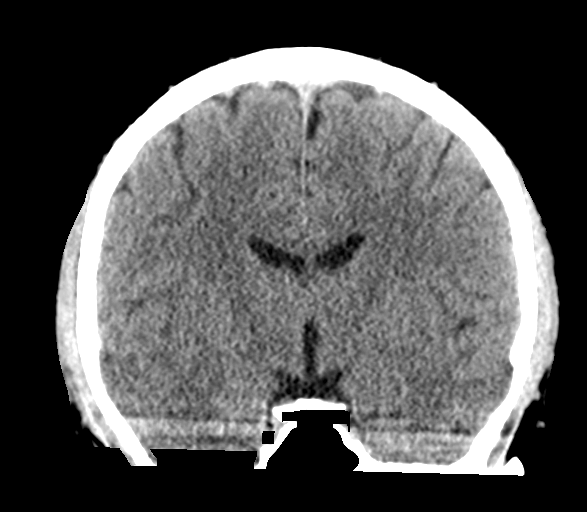

[Series 5: sagittal soft tissue · sagittal · 0.30mm/px · 3 of 57 slices shown]
[im 19/57  brain]
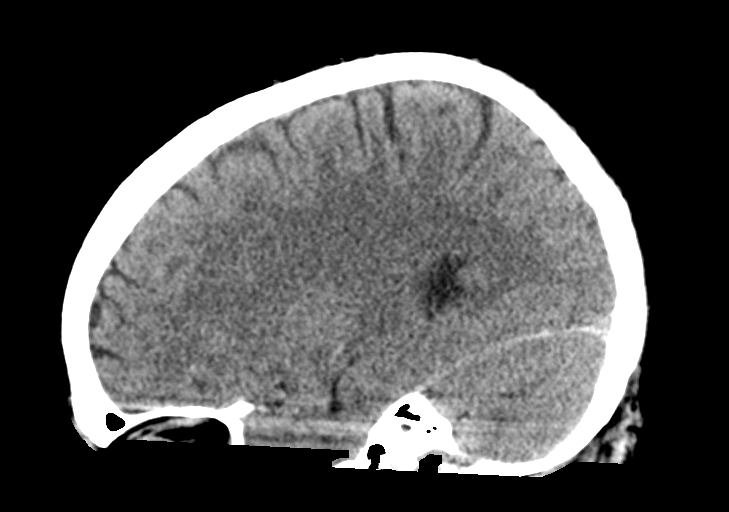
[im 29/57  brain]
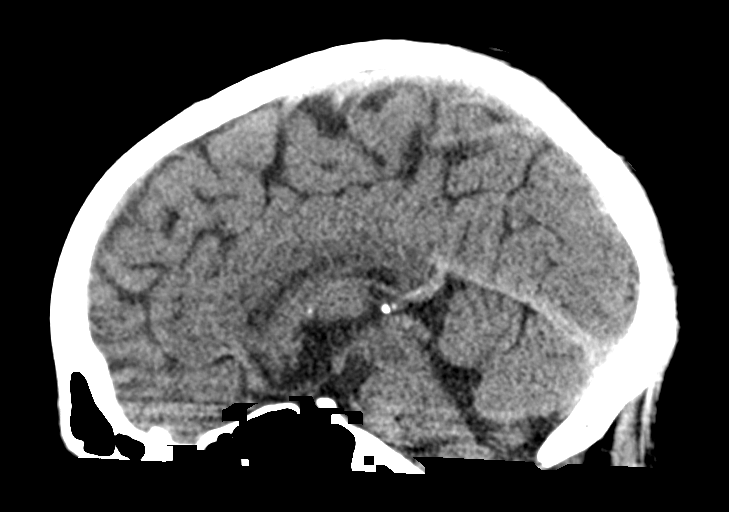
[im 38/57  brain]
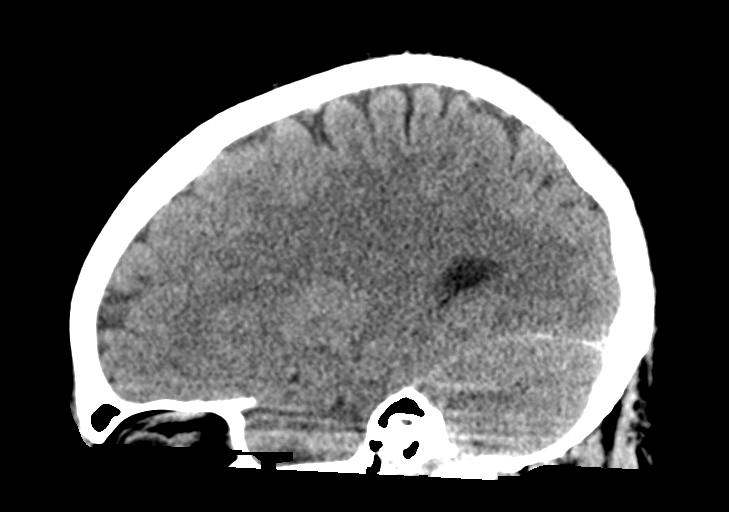

[15 of 47 positions shown; findings below may reference images not displayed]

FINDINGS: Brain: No evidence of acute infarction, hemorrhage, hydrocephalus,
extra-axial collection or mass lesion/mass effect.

Vascular: No hyperdense vessel or unexpected calcification.

Skull: Normal. Negative for fracture or focal lesion.

Sinuses/Orbits: No acute finding.

Other: None
IMPRESSION: Normal noncontrast CT of the brain.

## 2020-10-12 MED ORDER — SODIUM CHLORIDE 0.9 % IV BOLUS
1000.0000 mL | Freq: Once | INTRAVENOUS | Status: AC
  Administered 2020-10-12: 1000 mL via INTRAVENOUS

## 2020-10-12 MED ORDER — ACETAMINOPHEN 500 MG PO TABS
1000.0000 mg | ORAL_TABLET | Freq: Once | ORAL | Status: AC
  Administered 2020-10-12: 1000 mg via ORAL
  Filled 2020-10-12: qty 2

## 2020-10-12 MED ORDER — ONDANSETRON HCL 4 MG/2ML IJ SOLN
4.0000 mg | Freq: Once | INTRAMUSCULAR | Status: AC
  Administered 2020-10-12: 4 mg via INTRAVENOUS
  Filled 2020-10-12: qty 2

## 2020-10-12 NOTE — ED Notes (Signed)
Patient transported to CT 

## 2020-10-12 NOTE — ED Notes (Signed)
ED Provider at bedside. 

## 2020-10-12 NOTE — ED Triage Notes (Signed)
Pt comes via POV from home with c/o CP that started Monday. Pt states some SOB. Pt states he went to [facility] and was evaluated and they thought it was vertigo. Pt states he got home later and passed out in the yard.

## 2020-10-12 NOTE — Progress Notes (Signed)
Victor Bouillon, MD [address]  [address]  [place], [place] [address]  Main: [phone]  Fax: [phone]   Primary Care Physician: Malva Limes, MD   Chief Complaint  Patient presents with  . Gastroesophageal Reflux    HPI: Victor Fowler is a 25 y.o. male here for follow-up of GERD.  On previous visit in 2019 his symptoms had resolved.  However, his symptoms have reoccurred.  He was placed on PPI, Protonix by PCP and states his symptoms got worse including indigestion, epigastric pain and he stopped the medication.  Previous history: As per initial consult note.  "He had a CT abdomen with IV contrast on October 27 that showed no acute pathology. It noted an unremarkable liver. No biliary duct dilation. Unremarkable gallbladder. Unremarkable pancreas. No bowel obstruction. No acute inflammatory process. Appendix not definitely visualized but no secondary signs of appendicitis reported. Scattered colonic diverticulosis without evidence of diverticulitis. No ascites.  Patient also underwent gallbladder ultrasound on November 2 that reported a normal gallbladder and normal bile duct with no biliary duct dilation. The gallbladder was reported to be physiologically distended without sludge or gallstones. No gallbladder wall thickening was reported. The initial report reported a normal liver. However, follow-up addendum stated that diffuse hepatic steatosis with parenchymal sparing was seen at the gallbladder fossa.  He underwent upper GI with small bowel follow-through on December 4 that showed normal appearance of the stomach, duodenum, jejunum. However, he reported "some loss of the normal mucosal pattern of the jejunum with separation of bowel loops may reflect inflammatory change."  He had been placed on a short course of steroids by primary care provider due to the upper GI follow-through findings.   This had initially improved his symptoms, however  he called our office reporting abdominal pain,  His CRP was mildly elevated to 1.2 during his clinic visit with [place], and an EGD and colonoscopy were ordered to rule out IBD.  Colonoscopy was done on September 30, 2017, and apart from mild rectal erythema, the rest of the exam was normal including a normal TI.  All colon and TI biopsies were normal. EGD was done on September 30, 2017, and showed c24m1, Barrett's.  Mild gastric erythema.  Biopsies showed inactive atrophic gastritis, with intestinal metaplasia.  Reflux type changes were noted at the GE junction.  Current Outpatient Medications  Medication Sig Dispense Refill  . famotidine (PEPCID) 20 MG tablet Take 1 tablet (20 mg total) by mouth daily. 30 tablet 1   No current facility-administered medications for this visit.    Allergies as of 10/11/2020 - Review Complete 10/11/2020  Allergen Reaction Noted  . Watermelon flavor Anaphylaxis 03/20/2018    ROS:  General: Negative for anorexia, weight loss, fever, chills, fatigue, weakness. ENT: Negative for hoarseness, difficulty swallowing , nasal congestion. CV: Negative for chest pain, angina, palpitations, dyspnea on exertion, peripheral edema.  Respiratory: Negative for dyspnea at rest, dyspnea on exertion, cough, sputum, wheezing.  GI: See history of present illness. GU:  Negative for dysuria, hematuria, urinary incontinence, urinary frequency, nocturnal urination.  Endo: Negative for unusual weight change.    Physical Examination:   BP (!) 144/82   Pulse 93   Temp 98.6 F (37 C) (Oral)   Wt 194 lb (88 kg)   BMI 27.84 kg/m   General: Well-nourished, well-developed in no acute distress.  Eyes: No icterus. Conjunctivae pink. Mouth: Oropharyngeal mucosa moist and pink , no lesions erythema or exudate. Neck: Supple,  Trachea midline Abdomen: Bowel sounds are normal, nontender, nondistended, no hepatosplenomegaly or masses, no abdominal bruits or hernia , no rebound or guarding.    Extremities: No lower extremity edema. No clubbing or deformities. Neuro: Alert and oriented x 3.  Grossly intact. Skin: Warm and dry, no jaundice.   Psych: Alert and cooperative, normal mood and affect.   Labs: CMP     Component Value Date/Time   NA 138 09/03/2017 1328   NA 139 11/26/2012 1406   K 3.6 09/03/2017 1328   K 3.8 11/26/2012 1406   CL 105 09/03/2017 1328   CL 104 11/26/2012 1406   CO2 26 09/03/2017 1328   CO2 28 (H) 11/26/2012 1406   GLUCOSE 123 (H) 09/03/2017 1328   GLUCOSE 122 (H) 11/26/2012 1406   BUN 22 (H) 09/03/2017 1328   BUN 16 11/26/2012 1406   CREATININE 0.83 09/03/2017 1328   CREATININE 0.90 07/29/2017 1103   CALCIUM 9.3 09/03/2017 1328   CALCIUM 9.3 11/26/2012 1406   PROT 7.4 09/03/2017 1328   ALBUMIN 4.6 09/03/2017 1328   AST 23 09/03/2017 1328   ALT 35 09/03/2017 1328   ALKPHOS 71 09/03/2017 1328   BILITOT 0.7 09/03/2017 1328   GFRNONAA >60 09/03/2017 1328   GFRNONAA 121 07/29/2017 1103   GFRAA >60 09/03/2017 1328   GFRAA 140 07/29/2017 1103   Lab Results  Component Value Date   WBC 7.1 03/31/2018   HGB 15.5 03/31/2018   HCT 43.1 03/31/2018   MCV 90 03/31/2018   PLT 304 03/31/2018    Imaging Studies: No results found.  Assessment and Plan:   [name] is a 25 y.o. y/o male here for follow-up of reflux symptoms that have resumed as of the last 1 to 2 months, with worsening on PPI instead of improvement  Patient was supposed to get another EGD after his previous one for gastric mapping biopsies but never got this done Now with new symptoms, EGD indicated for further evaluation  I have discussed alternative options, risks & benefits,  which include, but are not limited to, bleeding, infection, perforation,respiratory complication & drug reaction.  The patient agrees with this plan & written consent will be obtained.    We will try H2 RA as Protonix did not work well for him  Patient educated extensively on acid reflux  lifestyle modification, including buying a bed wedge, not eating 3 hrs before bedtime, diet modifications, and handout given for the same.   His previous labs also showed mild elevation in CRP and we will repeat this.  Patient is denying any diarrhea.  In fact he sometimes has 1 to 2 days without a bowel movement.  No blood in stool.  Check CBC and liver enzymes as well   Dr Victor Fowler

## 2020-10-12 NOTE — ED Provider Notes (Signed)
[facility] Emergency Department Provider Note  ____________________________________________   Event Date/Time   First MD Initiated Contact with Patient 10/12/20 2034     (approximate)  I have reviewed the triage vital signs and the nursing notes.   HISTORY  Chief Complaint Chest Pain    HPI Victor Fowler is a 25 y.o. male with solitary kidney who comes in with dizziness.  Patient reports multiple symptoms including feeling dizzy, lightheaded, short of breath, chest pain.  The chest pain is in the middle of his chest, moderate, shoots around, nothing makes better, nothing makes it worse.  He states that he was seen in an outside ER this morning and was told that he had vertigo but they did no other work-up for him and he was sent home on meclizine.  He states that when he got home he passed out.  He states that he has had a headache but that has been going on for a long time now.  He reports as a child having an abnormal head CT showing some scar tissue.  Denies any sudden onset or severe headache this time.  Denies any risk factors for PE.  Denies any abdominal pain.    Past Medical History:  Diagnosis Date  . Fatty liver   . Solitary kidney, congenital     Patient Active Problem List   Diagnosis Date Noted  . Acute maxillary sinusitis 09/07/2020  . Ear pain, referred, right 09/07/2020  . Cervical lymphadenopathy 09/07/2020  . Gastroesophageal reflux disease 09/07/2020  . Gastrointestinal tract imaging abnormality   . Nausea, vomiting, and diarrhea   . Rectal inflammation   . Abdominal pain, epigastric   . Columnar epithelial-lined lower esophagus   . Stomach irritation   . Fatty liver 07/22/2017    Past Surgical History:  Procedure Laterality Date  . COLONOSCOPY WITH PROPOFOL N/A 09/30/2017   Procedure: COLONOSCOPY WITH PROPOFOL;  Surgeon: Pasty Spillers, MD;  Location: [facility];  Service: Endoscopy;  Laterality: N/A;  .  ESOPHAGOGASTRODUODENOSCOPY (EGD) WITH PROPOFOL N/A 09/30/2017   Procedure: ESOPHAGOGASTRODUODENOSCOPY (EGD) WITH PROPOFOL;  Surgeon: Pasty Spillers, MD;  Location: [facility];  Service: Endoscopy;  Laterality: N/A;  . none      Prior to Admission medications   Medication Sig Start Date End Date Taking? Authorizing Provider  famotidine (PEPCID) 20 MG tablet Take 1 tablet (20 mg total) by mouth daily. 10/11/20   Pasty Spillers, MD    Allergies Watermelon flavor  Family History  Problem Relation Age of Onset  . Cancer Maternal Grandmother        lung cancer, smoked  . Hypertension Maternal Grandfather   . Heart failure Maternal Grandfather   . Cancer Paternal Grandmother        lung (smoked)  . Diabetes Paternal Grandfather   . Hypertension Paternal Grandfather   . Colon cancer Neg Hx   . Breast cancer Neg Hx     Social History Social History   Tobacco Use  . Smoking status: Never Smoker  . Smokeless tobacco: Never Used  Vaping Use  . Vaping Use: Never used  Substance Use Topics  . Alcohol use: No  . Drug use: No      Review of Systems Constitutional: No fever/chills Eyes: No visual changes. ENT: No sore throat. Cardiovascular: Positive chest pain Respiratory: Positive shortness of breath. Gastrointestinal: No abdominal pain.  No nausea, no vomiting.  No diarrhea.  No constipation. Genitourinary: Negative for dysuria.  Musculoskeletal: Negative for back pain. Skin: Negative for rash. Neurological: Positive headaches, generalized weakness, dizziness All other ROS negative ____________________________________________   PHYSICAL EXAM:  VITAL SIGNS: ED Triage Vitals  Enc Vitals Group     BP 10/12/20 1857 (!) 169/93     Pulse Rate 10/12/20 1857 (!) 114     Resp 10/12/20 1857 19     Temp 10/12/20 1857 98 F (36.7 C)     Temp Source 10/12/20 2011 Oral     SpO2 10/12/20 1857 100 %     Weight --      Height --      Head Circumference --       Peak Flow --      Pain Score 10/12/20 1856 7     Pain Loc --      Pain Edu? --      Excl. in GC? --     Constitutional: Alert and oriented. Well appearing and in no acute distress. Eyes: Conjunctivae are normal. EOMI. Head: Atraumatic. Nose: No congestion/rhinnorhea. Mouth/Throat: Mucous membranes are moist.   Neck: No stridor. Trachea Midline. FROM Cardiovascular: Tachycardia, regular rhythm. Grossly normal heart sounds.  Good peripheral circulation. Respiratory: Normal respiratory effort.  No retractions. Lungs CTAB. Gastrointestinal: Soft and nontender. No distention. No abdominal bruits.  Musculoskeletal: No lower extremity tenderness nor edema.  No joint effusions. Neurologic:  Normal speech and language. No gross focal neurologic deficits are appreciated.  Skin:  Skin is warm, dry and intact. No rash noted. Psychiatric: Mood and affect are normal. Speech and behavior are normal. GU: Deferred   ____________________________________________   LABS (all labs ordered are listed, but only abnormal results are displayed)  Labs Reviewed  BASIC METABOLIC PANEL - Abnormal; Notable for the following components:      Result Value   Glucose, Bld 116 (*)    All other components within normal limits  URINALYSIS, COMPLETE (UACMP) WITH MICROSCOPIC - Abnormal; Notable for the following components:   Specific Gravity, Urine >1.030 (*)    Ketones, ur 15 (*)    All other components within normal limits  CBC  FIBRIN DERIVATIVES D-DIMER ([facility] ONLY)  BRAIN NATRIURETIC PEPTIDE  TROPONIN I (HIGH SENSITIVITY)  TROPONIN I (HIGH SENSITIVITY)   ____________________________________________   ED ECG REPORT I, Concha Se, the attending physician, personally viewed and interpreted this ECG.  Sinus tachycardia rate of 114, no ST elevation, T wave inversion in lead III, normal intervals ____________________________________________  RADIOLOGY Vela Prose, personally viewed and evaluated  these images (plain radiographs) as part of my medical decision making, as well as reviewing the written report by the radiologist.  ED MD interpretation: No pneumonia  Official radiology report(s): DG Chest 2 View  Result Date: 10/12/2020 CLINICAL DATA:  Shortness of breath chest pain for several days EXAM: CHEST - 2 VIEW COMPARISON:  None. FINDINGS: The heart size and mediastinal contours are within normal limits. Both lungs are clear. The visualized skeletal structures are unremarkable. IMPRESSION: No active cardiopulmonary disease. Electronically Signed   By: Alcide Clever M.D.   On: 10/12/2020 19:22    ____________________________________________   PROCEDURES  Procedure(s) performed (including Critical Care):  Procedures   ____________________________________________   INITIAL IMPRESSION / ASSESSMENT AND PLAN / ED COURSE   Victor Fowler was evaluated in Emergency Department on 10/12/2020 for the symptoms described in the history of present illness. He was evaluated in the context of the global COVID-19 pandemic, which necessitated consideration that the patient might  be at risk for infection with the SARS-CoV-2 virus that causes COVID-19. Institutional protocols and algorithms that pertain to the evaluation of patients at risk for COVID-19 are in a state of rapid change based on information released by regulatory bodies including the CDC and federal and state organizations. These policies and algorithms were followed during the patient's care in the ED.    Most Likely DDx:  -Patient presents with multiple symptoms.  For his chest pain he is very young age but will get cardiac markers and EKG given the syncopal episode.  Will get BNP to make sure no signs of heart failure although again low suspicion.  Will get D-dimer given cannot PERC him out due to tachycardia.  Patient reports constant daily headaches.  No sudden onset or severe headache to suggest subarachnoid hemorrhage.  However  patient states that he is really worried about his head given he had an abnormal CT when he was a child.  I reviewed a prior CT from 2010 that just stated that he had high density that could be hemorrhage versus just scar tissue.  Does not sound like he ever needed interventions so not quite sure how to interpret this.  At this point though I have very low suspicion for subarachnoid given his multitude of other symptoms as well.  DDx that was also considered d/t potential to cause harm, but was found less likely based on history and physical (as detailed above): -PNA (no fevers, cough but CXR to evaluate) -PNX (reassured with equal b/l breath sounds, CXR to evaluate) -Symptomatic anemia (will get H&H) -Pulmonary embolism as no sob at rest, not pleuritic in nature, no hypoxia -Aortic Dissection as no tearing pain and no radiation to the mid back, pulses equal -Pericarditis no rub on exam, EKG changes or hx to suggest dx -Tamponade (no notable SOB, tachycardic, hypotensive) -Esophageal rupture (no h/o diffuse vomitting/no crepitus)  Work-up was reassuring.  D-dimer was negative.  Cardiac markers were negative.  BNP was negative.  Patient was given 1 L of fluid and his tachycardia resolved.  At this point I think it safe to discharge patient home to follow-up with his PCP for further work-up     ____________________________________________   FINAL CLINICAL IMPRESSION(S) / ED DIAGNOSES   Final diagnoses:  Syncope, unspecified syncope type  Lightheadedness     MEDICATIONS GIVEN DURING THIS VISIT:  Medications  sodium chloride 0.9 % bolus 1,000 mL (0 mLs Intravenous Stopped 10/12/20 2227)  ondansetron (ZOFRAN) injection 4 mg (4 mg Intravenous Given 10/12/20 2105)  acetaminophen (TYLENOL) tablet 1,000 mg (1,000 mg Oral Given 10/12/20 2105)     ED Discharge Orders    None       Note:  This document was prepared using Dragon voice recognition software and may include unintentional  dictation errors.   Concha Se, MD 10/12/20 2232

## 2020-10-12 NOTE — Discharge Instructions (Addendum)
I have given you the numbers above to follow-up with neurology and cardiology.  Your work-up here was reassuring.  Return to ER if you develop any other worsening symptoms or any other concerns

## 2020-10-18 ENCOUNTER — Telehealth: Payer: Self-pay

## 2020-10-18 DIAGNOSIS — R42 Dizziness and giddiness: Secondary | ICD-10-CM

## 2020-10-18 NOTE — Telephone Encounter (Signed)
Copied from CRM [id number]. Topic: Referral - Request for Referral >> Oct 18, 2020  3:13 PM Leafy Ro wrote: Has patient seen PCP for this complaint? Yes . Pt went to  [facility] er on 10-12-2020 and they gave him a referral to see neurologist for dizziness and pt would like a sooner referral to dr Thelma Barge benz neurology at [facility] phone number [phone] and fax number [phone]. Pt has [facility] [employer]. Pt said dr benz is in network with [facility]

## 2020-10-19 NOTE — Addendum Note (Signed)
Addended by: Malva Limes on: 10/19/2020 12:26 PM   Modules accepted: Orders

## 2020-10-19 NOTE — Telephone Encounter (Signed)
Order placed

## 2020-10-24 ENCOUNTER — Telehealth: Payer: Self-pay

## 2020-10-24 NOTE — Telephone Encounter (Signed)
Unable to reschedule EGD with Dr. Maximino Greenland at this time because I was not able to contact patient due to no voicemail has been set up.  I will send a mychart message to him.  Thanks,  [place], [place]

## 2020-10-24 NOTE — Telephone Encounter (Signed)
Patient LVM yesterday to reschedule his EGD with Dr. Maximino Greenland.  Returned call today, however I was unable to speak to him to reschedule due to voicemail has not been set up yet.  I will send him a message via mychart.  Thanks,  [place], [place]

## 2020-10-25 ENCOUNTER — Ambulatory Visit: Admit: 2020-10-25 | Payer: Managed Care, Other (non HMO) | Admitting: Gastroenterology

## 2020-10-25 SURGERY — ESOPHAGOGASTRODUODENOSCOPY (EGD) WITH PROPOFOL
Anesthesia: General

## 2020-10-27 ENCOUNTER — Ambulatory Visit: Payer: Managed Care, Other (non HMO) | Admitting: Family Medicine

## 2020-10-31 ENCOUNTER — Other Ambulatory Visit: Payer: Self-pay

## 2020-10-31 DIAGNOSIS — K31A0 Gastric intestinal metaplasia, unspecified: Secondary | ICD-10-CM

## 2020-10-31 NOTE — Telephone Encounter (Signed)
Called and got patient scheduled for a EGD on 11/09/2020 with Dr. Agustin Cree. Went over instructions with patient and sent to [employer].

## 2020-11-07 ENCOUNTER — Other Ambulatory Visit
Admission: RE | Admit: 2020-11-07 | Discharge: 2020-11-07 | Disposition: A | Discharge status: Home / Self Care | Payer: Managed Care, Other (non HMO) | Source: Ambulatory Visit | Attending: Gastroenterology | Admitting: Gastroenterology

## 2020-11-07 ENCOUNTER — Other Ambulatory Visit: Payer: Self-pay

## 2020-11-07 DIAGNOSIS — Z20822 Contact with and (suspected) exposure to covid-19: Secondary | ICD-10-CM | POA: Diagnosis not present

## 2020-11-07 DIAGNOSIS — Z01812 Encounter for preprocedural laboratory examination: Secondary | ICD-10-CM | POA: Diagnosis present

## 2020-11-08 LAB — SARS CORONAVIRUS 2 (TAT 6-24 HRS): SARS Coronavirus 2: NEGATIVE

## 2020-11-09 ENCOUNTER — Encounter
Admission: RE | Disposition: A | Discharge status: Home / Self Care | Payer: Self-pay | Source: Home / Self Care | Attending: Gastroenterology

## 2020-11-09 ENCOUNTER — Ambulatory Visit
Admission: RE | Admit: 2020-11-09 | Discharge: 2020-11-09 | Disposition: A | Discharge status: Home / Self Care | Payer: Managed Care, Other (non HMO) | Attending: Gastroenterology | Admitting: Gastroenterology

## 2020-11-09 ENCOUNTER — Ambulatory Visit: Payer: Managed Care, Other (non HMO) | Admitting: Anesthesiology

## 2020-11-09 ENCOUNTER — Encounter: Payer: Self-pay | Admitting: Gastroenterology

## 2020-11-09 ENCOUNTER — Other Ambulatory Visit: Payer: Self-pay

## 2020-11-09 DIAGNOSIS — K2289 Other specified disease of esophagus: Secondary | ICD-10-CM | POA: Insufficient documentation

## 2020-11-09 DIAGNOSIS — Z8719 Personal history of other diseases of the digestive system: Secondary | ICD-10-CM | POA: Insufficient documentation

## 2020-11-09 DIAGNOSIS — K31A0 Gastric intestinal metaplasia, unspecified: Secondary | ICD-10-CM | POA: Diagnosis not present

## 2020-11-09 DIAGNOSIS — K317 Polyp of stomach and duodenum: Secondary | ICD-10-CM | POA: Insufficient documentation

## 2020-11-09 DIAGNOSIS — R12 Heartburn: Secondary | ICD-10-CM | POA: Diagnosis present

## 2020-11-09 DIAGNOSIS — Z87892 Personal history of anaphylaxis: Secondary | ICD-10-CM | POA: Insufficient documentation

## 2020-11-09 DIAGNOSIS — K3189 Other diseases of stomach and duodenum: Secondary | ICD-10-CM

## 2020-11-09 DIAGNOSIS — K219 Gastro-esophageal reflux disease without esophagitis: Secondary | ICD-10-CM | POA: Insufficient documentation

## 2020-11-09 DIAGNOSIS — Z9102 Food additives allergy status: Secondary | ICD-10-CM | POA: Diagnosis not present

## 2020-11-09 HISTORY — PX: ESOPHAGOGASTRODUODENOSCOPY (EGD) WITH PROPOFOL: SHX5813

## 2020-11-09 SURGERY — ESOPHAGOGASTRODUODENOSCOPY (EGD) WITH PROPOFOL
Anesthesia: General

## 2020-11-09 MED ORDER — PROPOFOL 500 MG/50ML IV EMUL
INTRAVENOUS | Status: AC
  Filled 2020-11-09: qty 50

## 2020-11-09 MED ORDER — PROPOFOL 10 MG/ML IV BOLUS
INTRAVENOUS | Status: AC
  Filled 2020-11-09: qty 20

## 2020-11-09 MED ORDER — PROPOFOL 500 MG/50ML IV EMUL
INTRAVENOUS | Status: DC | PRN
  Administered 2020-11-09: 150 ug/kg/min via INTRAVENOUS

## 2020-11-09 MED ORDER — SODIUM CHLORIDE 0.9 % IV SOLN: INTRAVENOUS | Status: DC

## 2020-11-09 MED ORDER — MIDAZOLAM HCL 2 MG/2ML IJ SOLN
INTRAMUSCULAR | Status: AC
  Filled 2020-11-09: qty 2

## 2020-11-09 MED ORDER — PROPOFOL 10 MG/ML IV BOLUS
INTRAVENOUS | Status: DC | PRN
  Administered 2020-11-09: 20 mg via INTRAVENOUS
  Administered 2020-11-09: 30 mg via INTRAVENOUS
  Administered 2020-11-09: 80 mg via INTRAVENOUS
  Administered 2020-11-09: 50 mg via INTRAVENOUS

## 2020-11-09 MED ORDER — MIDAZOLAM HCL 2 MG/2ML IJ SOLN
INTRAMUSCULAR | Status: DC | PRN
  Administered 2020-11-09 (×2): 1 mg via INTRAVENOUS

## 2020-11-09 MED ORDER — LIDOCAINE HCL (CARDIAC) PF 100 MG/5ML IV SOSY
PREFILLED_SYRINGE | INTRAVENOUS | Status: DC | PRN
  Administered 2020-11-09: 50 mg via INTRAVENOUS

## 2020-11-09 NOTE — Op Note (Signed)
[facility] Patient Name: Victor Fowler Procedure Date: 11/09/2020 10:17 AM MRN: [id number] Account #: [id number] Date of Birth: 12/25/1995 Admit Type: Outpatient Age: 25 Room: [facility] ENDO ROOM 2 Gender: Male Note Status: Finalized Procedure:             Upper GI endoscopy Indications:           Heartburn, Follow-up of intestinal metaplasia Providers:             Neiva Maenza B. Maximino Greenland MD, MD Referring MD:          Demetrios Isaacs. Sherrie Mustache, MD (Referring MD) Medicines:             Monitored Anesthesia Care Complications:         No immediate complications. Procedure:             Pre-Anesthesia Assessment:                        - The risks and benefits of the procedure and the                         sedation options and risks were discussed with the                         patient. All questions were answered and informed                         consent was obtained.                        - Patient identification and proposed procedure were                         verified prior to the procedure.                        - ASA Grade Assessment: II - A patient with mild                         systemic disease.                        After obtaining informed consent, the endoscope was                         passed under direct vision. Throughout the procedure,                         the patient's blood pressure, pulse, and oxygen                         saturations were monitored continuously. The Endoscope                         was introduced through the mouth, and advanced to the                         second part of duodenum. The upper GI endoscopy was  accomplished with ease. The patient tolerated the                         procedure well. Findings:      The Z-line was irregular. Mucosa was biopsied with a cold forceps for       histology in a targeted manner and in 4 quadrants.      The exam of the esophagus was otherwise  normal.      A single 4 mm sessile polyp with no stigmata of recent bleeding was       found in the gastric body. Imaging was performed to visualize the       mucosa. Biopsies were taken with a cold forceps for histology.      The exam of the stomach was otherwise normal.      There is no endoscopic evidence of erythema or inflammatory changes       suggestive of gastritis or inflammation in the entire examined stomach.       Biopsies were obtained on the greater curvature of the gastric body, on       the lesser curvature of the gastric body, at the incisura, on the       greater curvature of the gastric antrum and on the lesser curvature of       the gastric antrum with cold forceps for histology.      Patchy mildly erythematous mucosa without active bleeding and with no       stigmata of bleeding was found in the duodenal bulb.      The exam of the duodenum was otherwise normal. Impression:            - Z-line irregular. Biopsied.                        - A single gastric polyp. Biopsied.                        - Erythematous duodenopathy.                        - Biopsies were obtained on the greater curvature of                         the gastric body, on the lesser curvature of the                         gastric body, at the incisura, on the greater                         curvature of the gastric antrum and on the lesser                         curvature of the gastric antrum. Recommendation:        - Await pathology results.                        - Discharge patient to home.                        - Continue present medications.                        -  Resume previous diet.                        - The findings and recommendations were discussed with                         the patient.                        - The findings and recommendations were discussed with                         the patient's family.                        - Return to primary care physician as  previously                         scheduled.                        - Follow an antireflux regimen. Procedure Code(s):     --- Professional ---                        [phone], Esophagogastroduodenoscopy, flexible,                         transoral; with biopsy, single or multiple Diagnosis Code(s):     --- Professional ---                        K22.8, Other specified diseases of esophagus                        K31.7, Polyp of stomach and duodenum                        K31.89, Other diseases of stomach and duodenum                        R12, Heartburn CPT copyright 2019 American Medical Association. All rights reserved. The codes documented in this report are preliminary and upon coder review may  be revised to meet current compliance requirements.  [name], MD Michel Bickers B. Maximino Greenland MD, MD 11/09/2020 10:52:27 AM This report has been signed electronically. Number of Addenda: 0 Note Initiated On: 11/09/2020 10:17 AM Estimated Blood Loss:  Estimated blood loss: none.      [facility]

## 2020-11-09 NOTE — Anesthesia Preprocedure Evaluation (Signed)
Anesthesia Evaluation  Patient identified by MRN, date of birth, ID band Patient awake    Reviewed: Allergy & Precautions, NPO status , Patient's Chart, lab work & pertinent test results  History of Anesthesia Complications Negative for: history of anesthetic complications  Airway Mallampati: II       Dental   Pulmonary neg sleep apnea, neg COPD, Not current smoker,           Cardiovascular (-) hypertension(-) Past MI and (-) CHF (-) dysrhythmias (-) Valvular Problems/Murmurs     Neuro/Psych neg Seizures    GI/Hepatic Neg liver ROS, GERD  ,  Endo/Other  neg diabetes  Renal/GU Renal disease (congenital single kidney)     Musculoskeletal   Abdominal   Peds  Hematology   Anesthesia Other Findings   Reproductive/Obstetrics                             Anesthesia Physical Anesthesia Plan  ASA: II  Anesthesia Plan: General   Post-op Pain Management:    Induction: Intravenous  PONV Risk Score and Plan: 2 and Propofol infusion and TIVA  Airway Management Planned: Nasal Cannula  Additional Equipment:   Intra-op Plan:   Post-operative Plan:   Informed Consent: I have reviewed the patients History and Physical, chart, labs and discussed the procedure including the risks, benefits and alternatives for the proposed anesthesia with the patient or authorized representative who has indicated his/her understanding and acceptance.       Plan Discussed with:   Anesthesia Plan Comments:         Anesthesia Quick Evaluation

## 2020-11-09 NOTE — Anesthesia Postprocedure Evaluation (Signed)
Anesthesia Post Note  Patient: Victor Fowler  Procedure(s) Performed: ESOPHAGOGASTRODUODENOSCOPY (EGD) WITH PROPOFOL (N/A )  Patient location during evaluation: Endoscopy Anesthesia Type: General Level of consciousness: awake and alert Pain management: pain level controlled Vital Signs Assessment: post-procedure vital signs reviewed and stable Respiratory status: spontaneous breathing and respiratory function stable Cardiovascular status: stable Anesthetic complications: no   No complications documented.   Last Vitals:  Vitals:   11/09/20 0858 11/09/20 1045  BP: (!) 102/91 113/69  Pulse: (!) 102 88  Resp: 20 (!) 22  Temp: 36.7 C (!) 36.1 C  SpO2: 100% 92%    Last Pain:  Vitals:   11/09/20 1045  TempSrc: Temporal  PainSc: 0-No pain                 [name]

## 2020-11-09 NOTE — H&P (Signed)
[name], MD [address], [place], [place], [address] [address], [address], [place], [place], [address] Phone: [phone]  Fax: [phone]  Primary Care Physician:  Malva Limes, MD   Pre-Procedure History & Physical: HPI:  Victor Fowler is a 25 y.o. male is here for an EGD.   Past Medical History:  Diagnosis Date  . Fatty liver   . Solitary kidney, congenital     Past Surgical History:  Procedure Laterality Date  . COLONOSCOPY WITH PROPOFOL N/A 09/30/2017   Procedure: COLONOSCOPY WITH PROPOFOL;  Surgeon: Pasty Spillers, MD;  Location: [facility];  Service: Endoscopy;  Laterality: N/A;  . ESOPHAGOGASTRODUODENOSCOPY (EGD) WITH PROPOFOL N/A 09/30/2017   Procedure: ESOPHAGOGASTRODUODENOSCOPY (EGD) WITH PROPOFOL;  Surgeon: Pasty Spillers, MD;  Location: [facility];  Service: Endoscopy;  Laterality: N/A;  . none      Prior to Admission medications   Medication Sig Start Date End Date Taking? Authorizing Provider  famotidine (PEPCID) 20 MG tablet Take 1 tablet (20 mg total) by mouth daily. Patient not taking: Reported on 11/09/2020 10/11/20   Pasty Spillers, MD    Allergies as of 10/31/2020 - Review Complete 10/12/2020  Allergen Reaction Noted  . Watermelon flavor Anaphylaxis 03/20/2018    Family History  Problem Relation Age of Onset  . Cancer Maternal Grandmother        lung cancer, smoked  . Hypertension Maternal Grandfather   . Heart failure Maternal Grandfather   . Cancer Paternal Grandmother        lung (smoked)  . Diabetes Paternal Grandfather   . Hypertension Paternal Grandfather   . Colon cancer Neg Hx   . Breast cancer Neg Hx     Social History   Socioeconomic History  . Marital status: Divorced    Spouse name: Not on file  . Number of children: Not on file  . Years of education: Not on file  . Highest education level: Not on file  Occupational History  . Not on file  Tobacco Use  .  Smoking status: Never Smoker  . Smokeless tobacco: Never Used  Vaping Use  . Vaping Use: Never used  Substance and Sexual Activity  . Alcohol use: No  . Drug use: No  . Sexual activity: Not on file  Other Topics Concern  . Not on file  Social History Narrative  . Not on file   Social Determinants of Health   Financial Resource Strain: Not on file  Food Insecurity: Not on file  Transportation Needs: Not on file  Physical Activity: Not on file  Stress: Not on file  Social Connections: Not on file  Intimate Partner Violence: Not on file    Review of Systems: See HPI, otherwise negative ROS  Physical Exam: BP (!) 102/91   Pulse (!) 102   Temp 98 F (36.7 C) (Temporal)   Resp 20   Ht 5\' 6"  (1.676 m)   Wt 88.9 kg   SpO2 100%   BMI 31.64 kg/m  General:   Alert,  pleasant and cooperative in NAD Head:  Normocephalic and atraumatic. Neck:  Supple; no masses or thyromegaly. Lungs:  Clear throughout to auscultation, normal respiratory effort.    Heart:  +S1, +S2, Regular rate and rhythm, No edema. Abdomen:  Soft, nontender and nondistended. Normal bowel sounds, without guarding, and without rebound.   Neurologic:  Alert and  oriented x4;  grossly normal neurologically.  Impression/Plan: Victor Fowler is here  for an EGD for Acid Reflux, gastric intestinal metaplasia.  Risks, benefits, limitations, and alternatives regarding the procedure have been reviewed with the patient.  Questions have been answered.  All parties agreeable.   Pasty Spillers, MD  11/09/2020, 10:16 AM

## 2020-11-09 NOTE — Transfer of Care (Signed)
Immediate Anesthesia Transfer of Care Note  Patient: Victor Fowler  Procedure(s) Performed: ESOPHAGOGASTRODUODENOSCOPY (EGD) WITH PROPOFOL (N/A )  Patient Location: PACU  Anesthesia Type:General  Level of Consciousness: sedated  Airway & Oxygen Therapy: Patient Spontanous Breathing  Post-op Assessment: Report given to RN and Post -op Vital signs reviewed and stable  Post vital signs: Reviewed and stable  Last Vitals:  Vitals Value Taken Time  BP 113/69 11/09/20 1045  Temp 36.1 C 11/09/20 1045  Pulse 89 11/09/20 1046  Resp 22 11/09/20 1046  SpO2 92 % 11/09/20 1046  Vitals shown include unvalidated device data.  Last Pain:  Vitals:   11/09/20 1045  TempSrc: Temporal  PainSc: 0-No pain         Complications: No complications documented.

## 2020-11-10 LAB — SURGICAL PATHOLOGY

## 2020-11-16 ENCOUNTER — Encounter: Payer: Self-pay | Admitting: Gastroenterology

## 2020-11-16 ENCOUNTER — Ambulatory Visit: Payer: Managed Care, Other (non HMO) | Admitting: Gastroenterology

## 2020-11-16 ENCOUNTER — Other Ambulatory Visit: Payer: Self-pay

## 2020-11-16 VITALS — BP 135/82 | HR 85 | Wt 189.0 lb

## 2020-11-16 DIAGNOSIS — K760 Fatty (change of) liver, not elsewhere classified: Secondary | ICD-10-CM | POA: Diagnosis not present

## 2020-11-16 DIAGNOSIS — R748 Abnormal levels of other serum enzymes: Secondary | ICD-10-CM

## 2020-11-16 DIAGNOSIS — R109 Unspecified abdominal pain: Secondary | ICD-10-CM | POA: Diagnosis not present

## 2020-11-16 NOTE — Progress Notes (Signed)
[name], MD [address]  [address]  [place], [place] [address]  Main: [phone]  Fax: [phone]   Primary Care Physician: Malva Limes, MD   Chief Complaint  Patient presents with  . Abdominal Pain    Patient stated that he continues to have RUQ abdominal pain and some nausea in the AM and the PM.    HPI: Victor Fowler is a 25 y.o. male here for follow-up of reflux and abdominal pain.  Patient states his reflux symptoms have much improved.  He will have very occasional heartburn, maybe once a week.  Therefore he is not taking any medications, including H2 RA that was previously prescribed.  No PPIs.  However, does report right-sided mid abdomen to flank pain.  This worsens when he lays on that side.  Reports nausea but no vomiting.  No dysphagia.  No current outpatient medications on file.   No current facility-administered medications for this visit.    Allergies as of 11/16/2020 - Review Complete 11/16/2020  Allergen Reaction Noted  . Watermelon flavor Anaphylaxis 03/20/2018    ROS:  General: Negative for anorexia, weight loss, fever, chills, fatigue, weakness. ENT: Negative for hoarseness, difficulty swallowing , nasal congestion. CV: Negative for chest pain, angina, palpitations, dyspnea on exertion, peripheral edema.  Respiratory: Negative for dyspnea at rest, dyspnea on exertion, cough, sputum, wheezing.  GI: See history of present illness. GU:  Negative for dysuria, hematuria, urinary incontinence, urinary frequency, nocturnal urination.  Endo: Negative for unusual weight change.    Physical Examination:   BP 135/82   Pulse 85   Wt 189 lb (85.7 kg)   BMI 30.51 kg/m   General: Well-nourished, well-developed in no acute distress.  Eyes: No icterus. Conjunctivae pink. Mouth: Oropharyngeal mucosa moist and pink , no lesions erythema or exudate. Neck: Supple, Trachea midline Abdomen: Bowel sounds are normal, nontender, nondistended,  no hepatosplenomegaly or masses, no abdominal bruits or hernia , no rebound or guarding.   Extremities: No lower extremity edema. No clubbing or deformities. Neuro: Alert and oriented x 3.  Grossly intact. Skin: Warm and dry, no jaundice.   Psych: Alert and cooperative, normal mood and affect.   Labs: CMP     Component Value Date/Time   NA 138 10/12/2020 1857   NA 139 11/26/2012 1406   K 3.7 10/12/2020 1857   K 3.8 11/26/2012 1406   CL 104 10/12/2020 1857   CL 104 11/26/2012 1406   CO2 22 10/12/2020 1857   CO2 28 (H) 11/26/2012 1406   GLUCOSE 116 (H) 10/12/2020 1857   GLUCOSE 122 (H) 11/26/2012 1406   BUN 16 10/12/2020 1857   BUN 16 11/26/2012 1406   CREATININE 0.96 10/12/2020 1857   CREATININE 0.90 07/29/2017 1103   CALCIUM 9.7 10/12/2020 1857   CALCIUM 9.3 11/26/2012 1406   PROT 7.4 09/03/2017 1328   ALBUMIN 4.6 09/03/2017 1328   AST 23 09/03/2017 1328   ALT 35 09/03/2017 1328   ALKPHOS 71 09/03/2017 1328   BILITOT 0.7 09/03/2017 1328   GFRNONAA >60 10/12/2020 1857   GFRNONAA 121 07/29/2017 1103   GFRAA >60 09/03/2017 1328   GFRAA 140 07/29/2017 1103   Lab Results  Component Value Date   WBC 9.7 10/12/2020   HGB 15.8 10/12/2020   HCT 45.9 10/12/2020   MCV 91.3 10/12/2020   PLT 379 10/12/2020    Imaging Studies: No results found.  Assessment and Plan:   Victor Fowler is a 25  y.o. y/o male here for follow-up of heartburn and abdominal pain  Heartburn has completely resolved  Abdominal pain most likely musculoskeletal etiology, warm and cold packs recommended  However, given proximity of the location of pain to the right upper quadrant, obtain right upper quadrant ultrasound Patient had elevated transaminases in February 2022 and did not get repeat labs ordered when I saw him on last clinic visit, obtain at this time, including fatty liver work-up  Finding of fatty liver on imaging discussed with patient Diet, weight loss, and exercise encouraged along  with avoiding hepatotoxic drugs including alcohol Risk of progression to cirrhosis if above measures are not instituted were discussed as well, and patient verbalized understanding    Dr [name]

## 2020-11-16 NOTE — Patient Instructions (Signed)
For your ultrasound appointment, please arrive 30 minutes prior and make sure that you do not eat or drink 4 hours prior.

## 2020-11-21 LAB — ANTI-MICROSOMAL ANTIBODY LIVER / KIDNEY: LKM1 Ab: 0.9 U (ref 0.0–20.0)

## 2020-11-21 LAB — HEPATIC FUNCTION PANEL
ALT: 34 IU/L (ref 0–44)
AST: 16 IU/L (ref 0–40)
Albumin: 4.6 g/dL (ref 4.1–5.2)
Alkaline Phosphatase: 99 IU/L (ref 44–121)
Bilirubin Total: 0.3 mg/dL (ref 0.0–1.2)
Bilirubin, Direct: <0.1 mg/dL (ref 0.00–0.40)
Total Protein: 7.2 g/dL (ref 6.0–8.5)

## 2020-11-21 LAB — HEPATITIS A ANTIBODY, TOTAL: hep A Total Ab: POSITIVE — AB

## 2020-11-21 LAB — ANA: ANA Titer 1: NEGATIVE

## 2020-11-21 LAB — MITOCHONDRIAL/SMOOTH MUSCLE AB PNL
Mitochondrial Ab: <20 Units (ref 0.0–20.0)
Smooth Muscle Ab: 3 Units (ref 0–19)

## 2020-11-21 LAB — IRON AND TIBC
Iron Saturation: 32 % (ref 15–55)
Iron: 88 ug/dL (ref 38–169)
Total Iron Binding Capacity: 277 ug/dL (ref 250–450)
UIBC: 189 ug/dL (ref 111–343)

## 2020-11-21 LAB — HEPATITIS B CORE ANTIBODY, TOTAL: Hep B Core Total Ab: NEGATIVE

## 2020-11-21 LAB — HEPATITIS C ANTIBODY: Hep C Virus Ab: <0.1 s/co ratio (ref 0.0–0.9)

## 2020-11-21 LAB — FERRITIN: Ferritin: 402 ng/mL — ABNORMAL HIGH (ref 30–400)

## 2020-11-21 LAB — HEPATITIS B SURFACE ANTIBODY,QUALITATIVE: Hep B Surface Ab, Qual: NONREACTIVE

## 2020-11-21 LAB — CERULOPLASMIN: Ceruloplasmin: 20.2 mg/dL (ref 16.0–31.0)

## 2020-11-21 LAB — HEPATITIS B SURFACE ANTIGEN: Hepatitis B Surface Ag: NEGATIVE

## 2020-11-21 LAB — IGG: IgG (Immunoglobin G), Serum: 904 mg/dL (ref 603–1613)

## 2020-11-29 ENCOUNTER — Ambulatory Visit: Payer: Managed Care, Other (non HMO) | Attending: Gastroenterology

## 2020-12-06 ENCOUNTER — Emergency Department: Payer: Managed Care, Other (non HMO)

## 2020-12-06 ENCOUNTER — Emergency Department
Admission: EM | Admit: 2020-12-06 | Discharge: 2020-12-07 | Disposition: A | Discharge status: Home / Self Care | Payer: Managed Care, Other (non HMO) | Attending: Emergency Medicine | Admitting: Emergency Medicine

## 2020-12-06 ENCOUNTER — Telehealth: Payer: Self-pay

## 2020-12-06 ENCOUNTER — Ambulatory Visit: Payer: Self-pay | Admitting: *Deleted

## 2020-12-06 ENCOUNTER — Other Ambulatory Visit: Payer: Self-pay

## 2020-12-06 DIAGNOSIS — M542 Cervicalgia: Secondary | ICD-10-CM | POA: Diagnosis not present

## 2020-12-06 DIAGNOSIS — R519 Headache, unspecified: Secondary | ICD-10-CM | POA: Diagnosis present

## 2020-12-06 DIAGNOSIS — M6281 Muscle weakness (generalized): Secondary | ICD-10-CM | POA: Insufficient documentation

## 2020-12-06 DIAGNOSIS — H538 Other visual disturbances: Secondary | ICD-10-CM | POA: Diagnosis not present

## 2020-12-06 LAB — BASIC METABOLIC PANEL
Anion gap: 9 (ref 5–15)
BUN: 16 mg/dL (ref 6–20)
CO2: 24 mmol/L (ref 22–32)
Calcium: 9.5 mg/dL (ref 8.9–10.3)
Chloride: 104 mmol/L (ref 98–111)
Creatinine, Ser: 1.08 mg/dL (ref 0.61–1.24)
GFR, Estimated: >60 mL/min (ref >60)
Glucose, Bld: 101 mg/dL — ABNORMAL HIGH (ref 70–99)
Potassium: 3.7 mmol/L (ref 3.5–5.1)
Sodium: 137 mmol/L (ref 135–145)

## 2020-12-06 LAB — CBC
HCT: 43.9 % (ref 39.0–52.0)
Hemoglobin: 15.5 g/dL (ref 13.0–17.0)
MCH: 31.4 pg (ref 26.0–34.0)
MCHC: 35.3 g/dL (ref 30.0–36.0)
MCV: 88.9 fL (ref 80.0–100.0)
Platelets: 296 10*3/uL (ref 150–400)
RBC: 4.94 MIL/uL (ref 4.22–5.81)
RDW: 12.4 % (ref 11.5–15.5)
WBC: 7.8 10*3/uL (ref 4.0–10.5)
nRBC: 0 % (ref 0.0–0.2)

## 2020-12-06 IMAGING — MR MR MRA NECK WO/W CM
7 of 9 series · 37 of 48 positions shown · IV contrast (8ml Gadavist)
Comparison: None.

CLINICAL DATA: Neck pain with blurry vision

EXAM:
MRI HEAD WITHOUT AND WITH CONTRAST
MRA HEAD WITHOUT CONTRAST
MRA NECK WITHOUT AND WITH CONTRAST
TECHNIQUE: Multiplanar, multiecho pulse sequences of the brain and surrounding
structures were obtained without and with intravenous contrast.
Angiographic images of the Circle of Willis were obtained using MRA
technique without intravenous contrast. Angiographic images of the
neck were obtained using MRA technique without and with intravenous
contrast. Carotid stenosis measurements (when applicable) are
obtained utilizing NASCET criteria, using the distal internal
carotid diameter as the denominator.
CONTRAST:  8mL GADAVIST GADOBUTROL 1 MMOL/ML IV SOLN

[Series 32: angio_fl3d_cor_pre_ttc=2.0s · coronal · 0.9mm · 0.85mm/px · 8 of 96 slices shown]
[im 1/96]
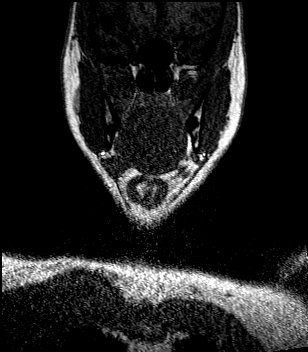
[im 14/96]
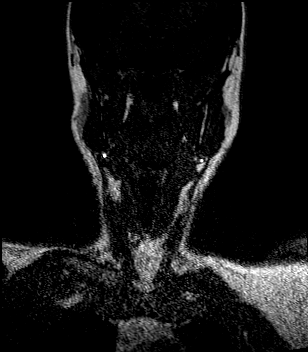
[im 28/96]
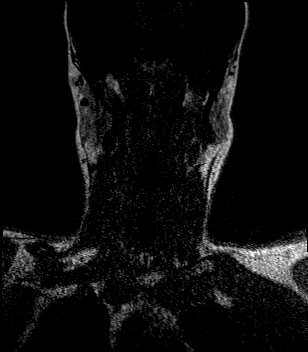
[im 41/96]
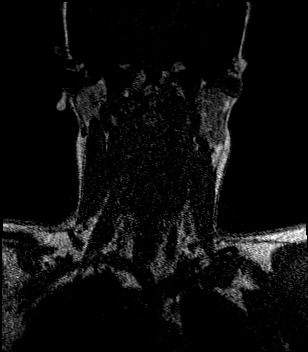
[im 55/96]
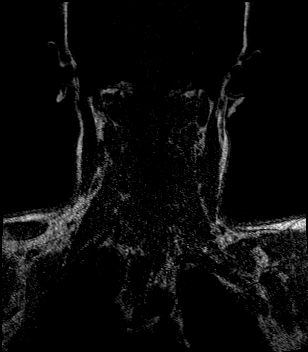
[im 68/96]
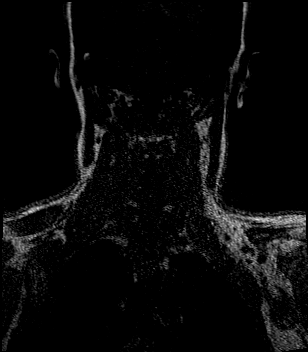
[im 82/96]
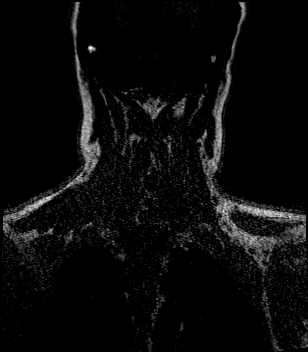
[im 96/96]
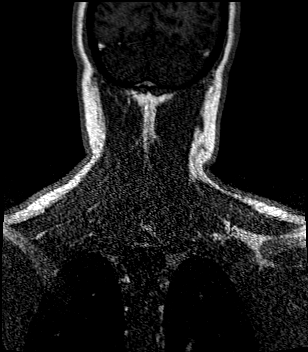

[Series 34: t1_se_fs_tra_blood-suppr. · axial · 2.5mm · 0.62mm/px · z∈[-237,-33]mm · 6 of 75 slices shown]
[im 1/75]
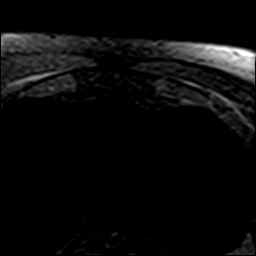
[im 15/75]
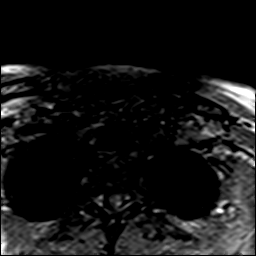
[im 30/75]
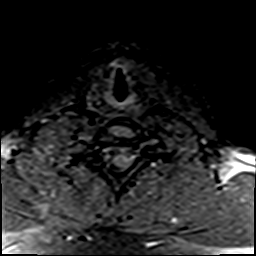
[im 45/75]
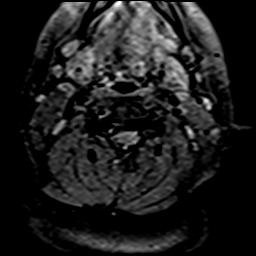
[im 60/75]
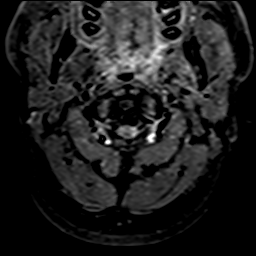
[im 75/75]
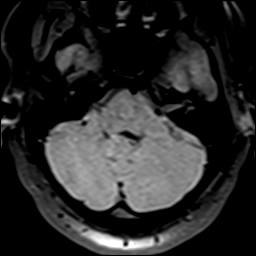

[Series 40: angio_fl3d_cor_post_ttc=2.0s · coronal · 0.9mm · 0.85mm/px · 7 of 94 slices shown]
[im 1/94]
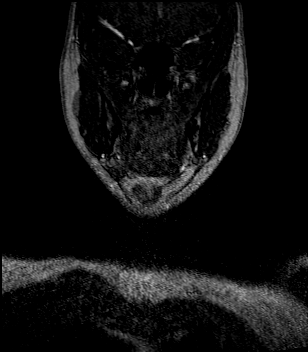
[im 16/94]
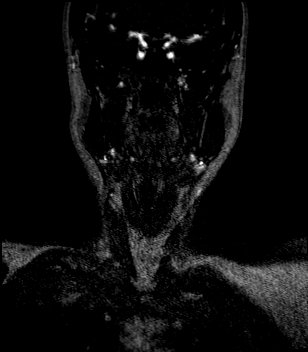
[im 32/94]
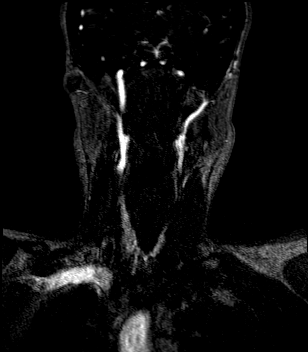
[im 47/94]
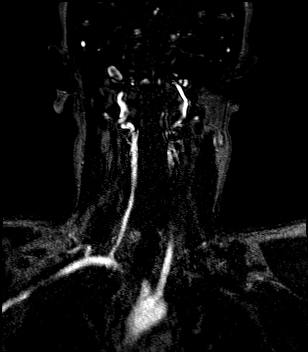
[im 63/94]
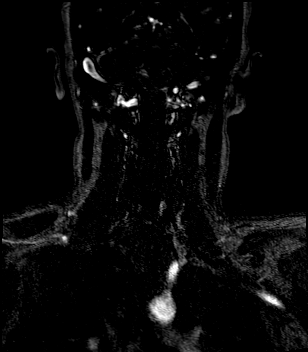
[im 78/94]
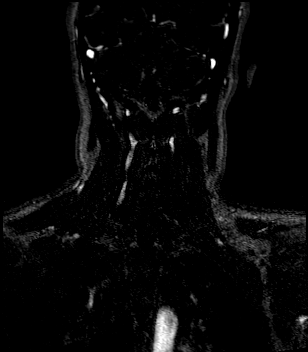
[im 94/94]
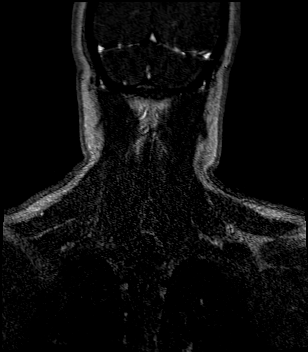

[Series 41: angio_fl3d_cor_post_ttc=2.0s_moco-adv · coronal · 0.9mm · 0.85mm/px · 7 of 94 slices shown]
[im 1/94]
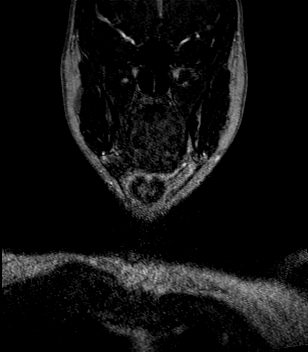
[im 16/94]
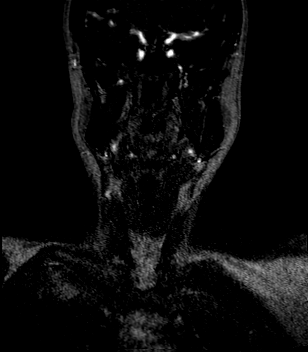
[im 32/94]
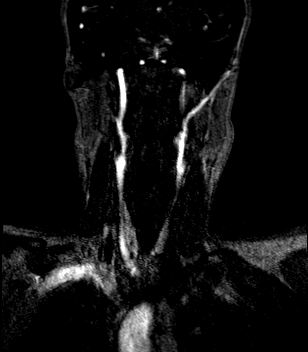
[im 47/94]
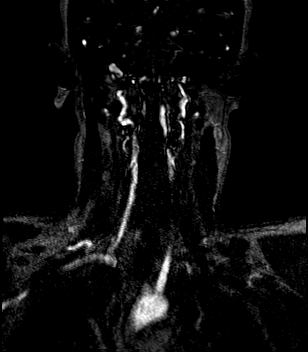
[im 63/94]
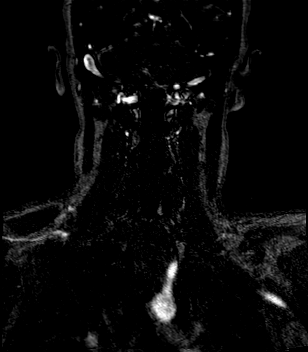
[im 78/94]
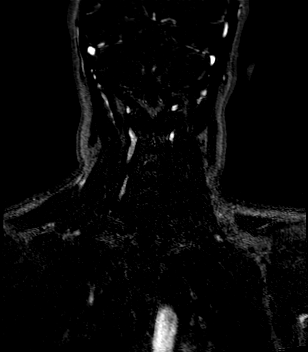
[im 94/94]
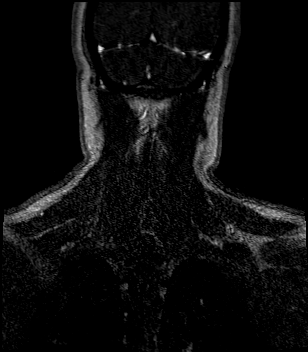

[Series 42: angio_fl3d_cor_post_ttc=2.0s_moco-adv_sub · coronal · 0.9mm · 0.85mm/px · 7 of 83 slices shown]
[im 1/83]
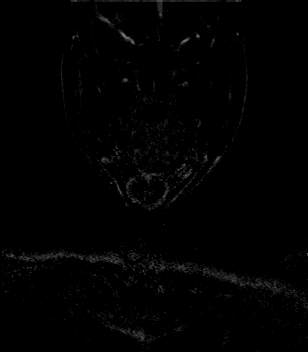
[im 14/83]
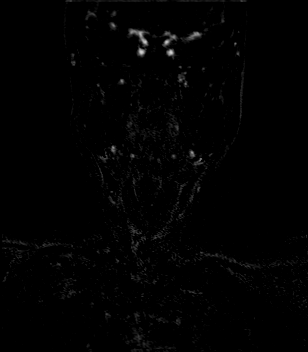
[im 28/83]
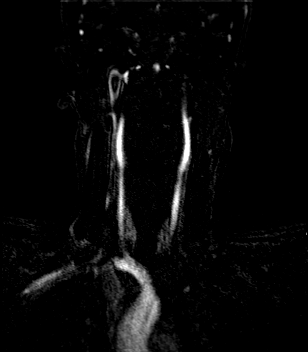
[im 42/83]
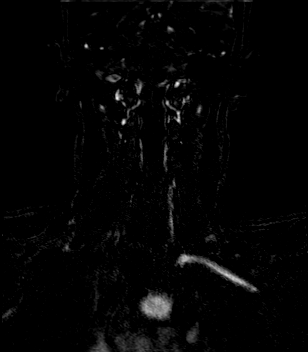
[im 55/83]
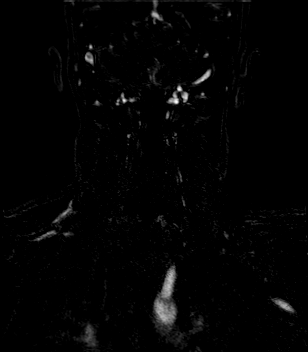
[im 69/83]
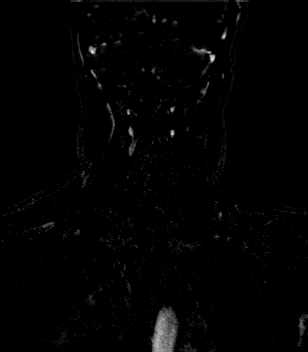
[im 83/83]
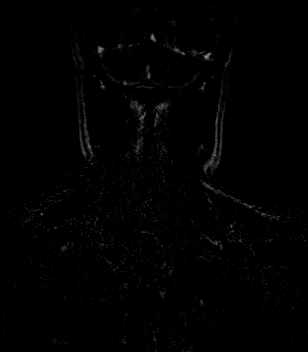

[Series 1117: neck h to · axial · 0.5mm · 0.43mm/px · 1 of 3 slices shown]
[im 1/3]
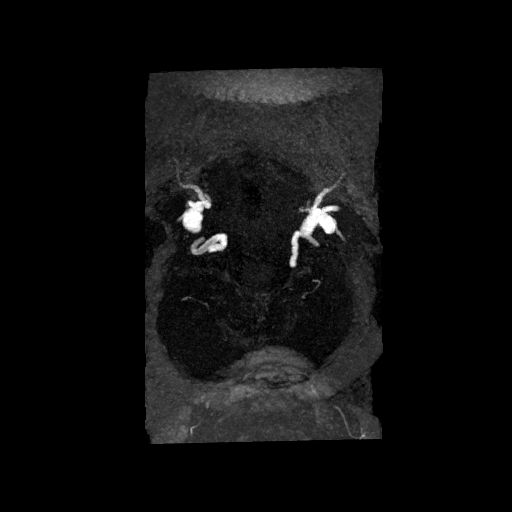

[Series 1200: verts · coronal · 0.9mm · 0.43mm/px · 1 of 4 slices shown]
[im 1/4]
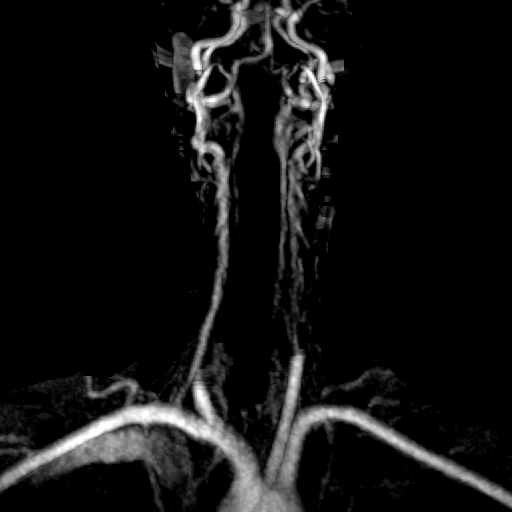

[37 of 48 positions shown; findings below may reference images not displayed]

FINDINGS: MRI HEAD FINDINGS

Brain: No acute infarct, mass effect or extra-axial collection. No
acute or chronic hemorrhage. Normal white matter signal, parenchymal
volume and CSF spaces. The midline structures are normal. There is
no abnormal contrast enhancement.

Vascular: Major flow voids are preserved.

Skull and upper cervical spine: Normal calvarium and skull base.
Visualized upper cervical spine and soft tissues are normal.

Sinuses/Orbits:No paranasal sinus fluid levels or advanced mucosal
thickening. Right maxillary retention cyst. No mastoid or middle ear
effusion. Normal orbits.

MRA HEAD FINDINGS

POSTERIOR CIRCULATION:

--Vertebral arteries: Normal

--Inferior cerebellar arteries: Normal.

--Basilar artery: Normal.

--Superior cerebellar arteries: Normal.

--Posterior cerebral arteries: Normal.

ANTERIOR CIRCULATION:

--Intracranial internal carotid arteries: Normal.

--Anterior cerebral arteries (ACA): Normal.

--Middle cerebral arteries (MCA): Normal.

ANATOMIC VARIANTS: Both P comms are patent.

MRA NECK FINDINGS

Normal carotid and vertebral arteries.  No evidence of dissection.
IMPRESSION: 1. Normal MRI of the brain.
2. Normal MRA of the head and neck.

## 2020-12-06 IMAGING — MR MR HEAD WO/W CM
13 of 14 series · 39 of 48 positions shown · IV contrast (8ml Gadavist)
Comparison: None.

CLINICAL DATA: Neck pain with blurry vision

EXAM:
MRI HEAD WITHOUT AND WITH CONTRAST
MRA HEAD WITHOUT CONTRAST
MRA NECK WITHOUT AND WITH CONTRAST
TECHNIQUE: Multiplanar, multiecho pulse sequences of the brain and surrounding
structures were obtained without and with intravenous contrast.
Angiographic images of the Circle of Willis were obtained using MRA
technique without intravenous contrast. Angiographic images of the
neck were obtained using MRA technique without and with intravenous
contrast. Carotid stenosis measurements (when applicable) are
obtained utilizing NASCET criteria, using the distal internal
carotid diameter as the denominator.
CONTRAST:  8mL GADAVIST GADOBUTROL 1 MMOL/ML IV SOLN

[Series 5: T1 · sagittal · 5.0mm · 0.62mm/px · 1 of 25 slices shown (1 of 2)]
[im 1/25]
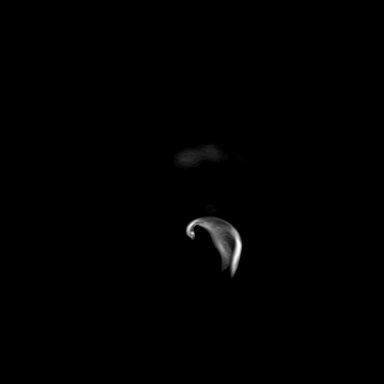

[Series 5: ax dwi_tracew · axial · 3.0mm · 0.65mm/px · z∈[-69,+85]mm · 5 of 96 slices shown]
[im 1/96]
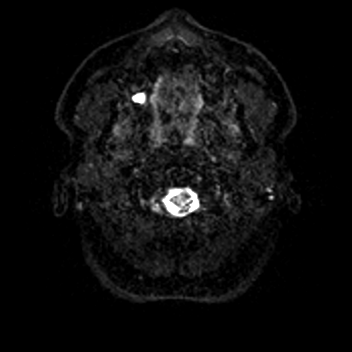
[im 24/96]
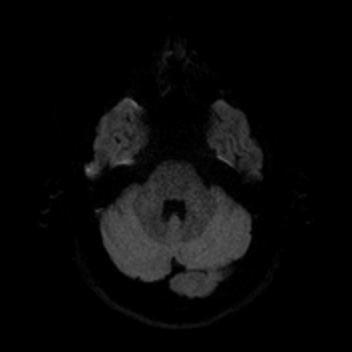
[im 48/96]
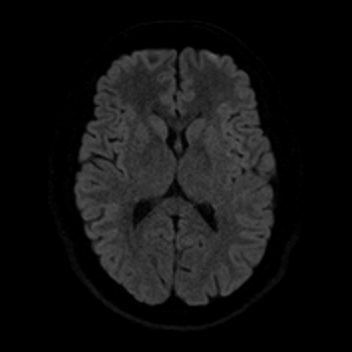
[im 72/96]
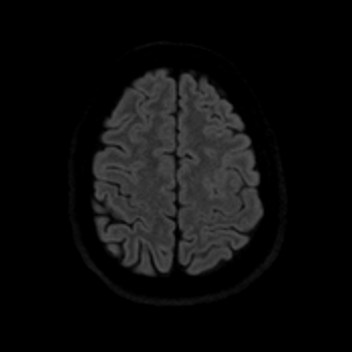
[im 96/96]
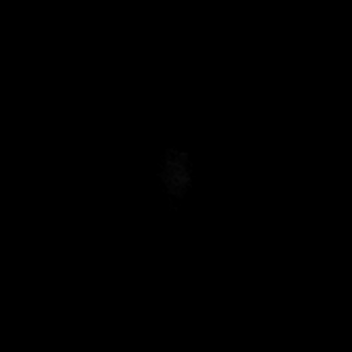

[Series 6: T2 · axial · 5.0mm · 0.53mm/px · 1 of 25 slices shown (1 of 2)]
[im 1/25]
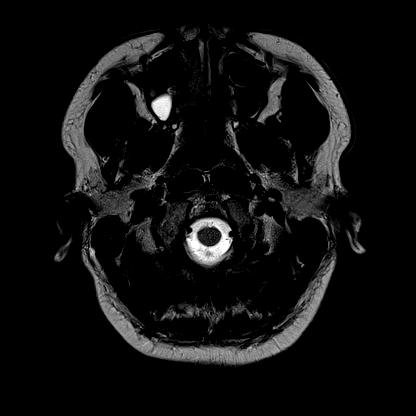

[Series 6: ax dwi_adc · axial · 3.0mm · 0.65mm/px · z∈[-69,+85]mm · 2 of 48 slices shown]
[im 1/48]
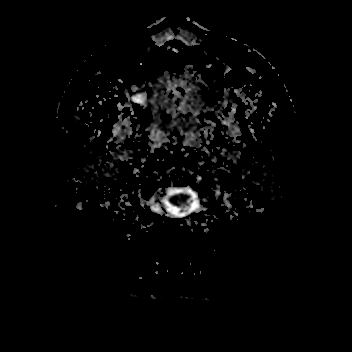
[im 48/48]
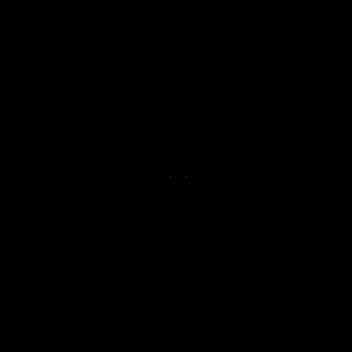

[Series 7: cor dwi_tracew · coronal · 5.0mm · 0.65mm/px · 3 of 80 slices shown]
[im 1/80]
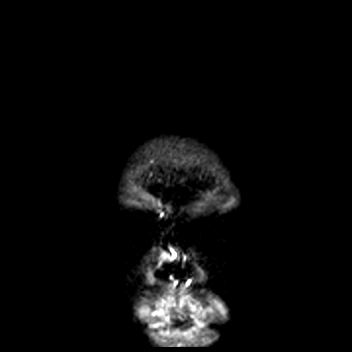
[im 40/80]
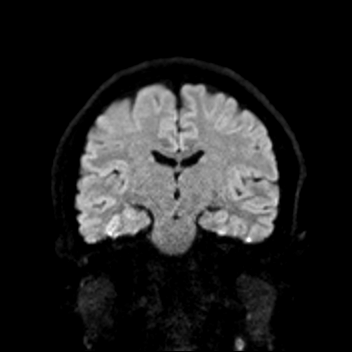
[im 80/80]
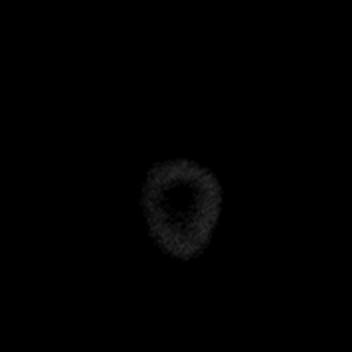

[Series 8: cor dwi_adc · coronal · 5.0mm · 0.65mm/px · 2 of 40 slices shown]
[im 1/40]
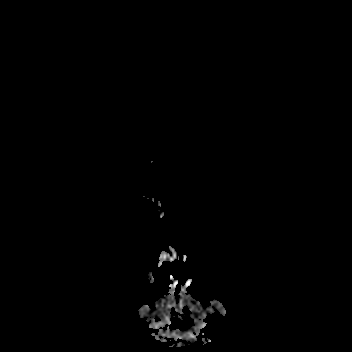
[im 40/40]
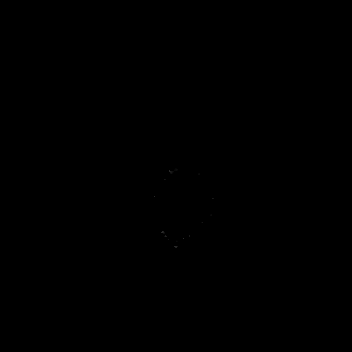

[Series 8: pha_images · axial · 3.0mm · 0.90mm/px · z∈[-77,+90]mm · 2 of 57 slices shown]
[im 1/57]
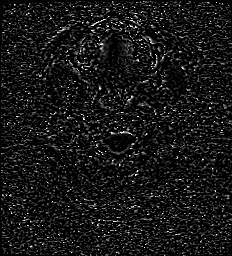
[im 57/57]
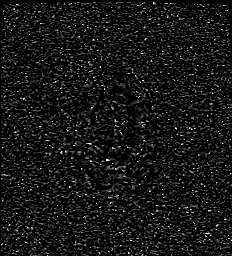

[Series 9: swi_images · axial · 3.0mm · 0.90mm/px · z∈[-77,+99]mm · 3 of 60 slices shown]
[im 1/60]
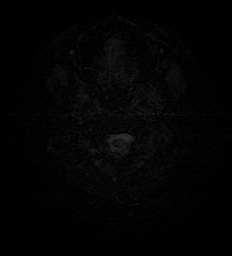
[im 30/60]
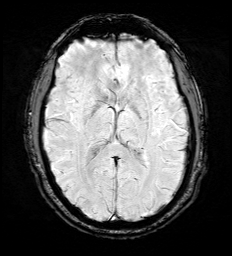
[im 60/60]
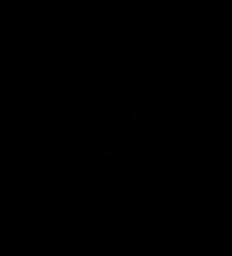

[Series 11: FLAIR · axial · 3.0mm · 0.53mm/px · z∈[-69,+92]mm · 2 of 55 slices shown]
[im 1/55]
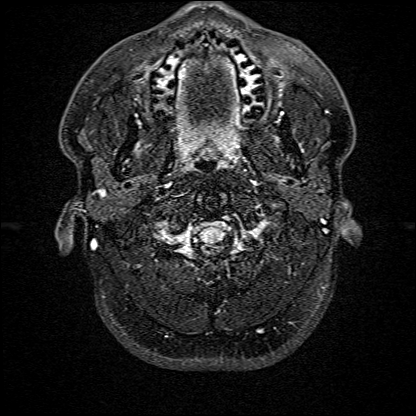
[im 55/55]
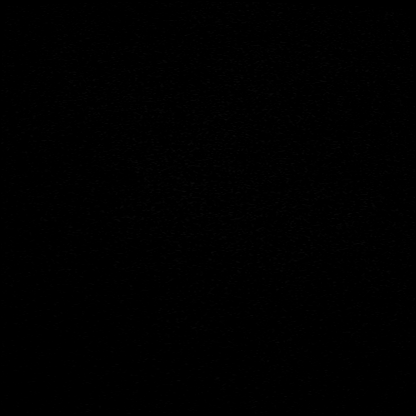

[Series 12: T1 · axial · 1.0mm · 0.98mm/px · z∈[-76,+96]mm · 8 of 174 slices shown (2 of 2)]
[im 1/174]
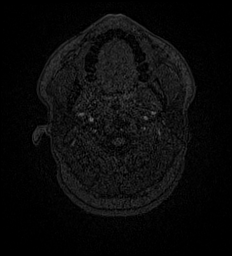
[im 25/174]
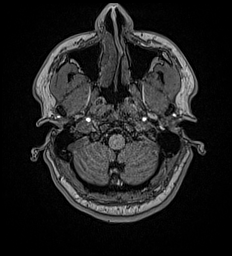
[im 50/174]
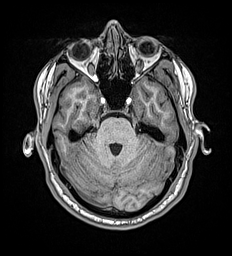
[im 75/174]
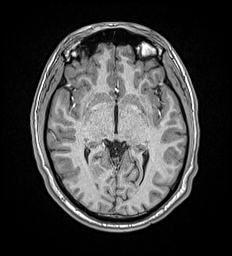
[im 99/174]
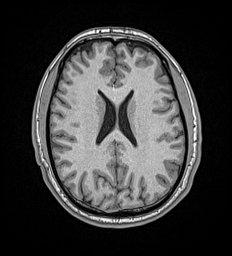
[im 124/174]
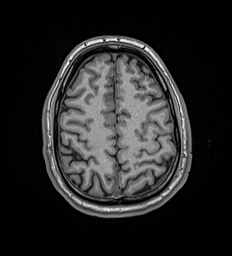
[im 149/174]
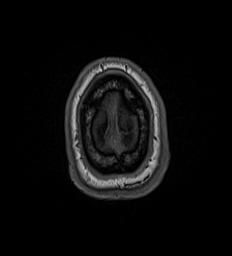
[im 174/174]
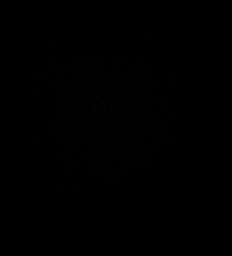

[Series 13: T2 · coronal · 5.0mm · 0.57mm/px · 1 of 29 slices shown (2 of 2)]
[im 1/29]
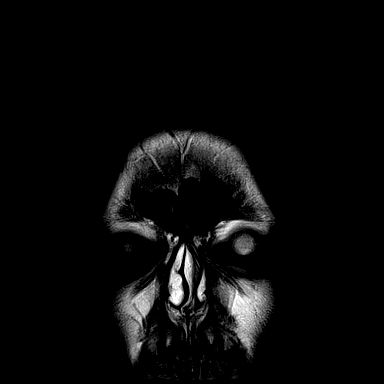

[Series 44: T1 post-contrast · axial · 1.0mm · 0.98mm/px · z∈[-76,+97]mm · 8 of 176 slices shown (1 of 2)]
[im 1/176]
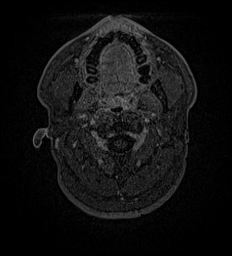
[im 26/176]
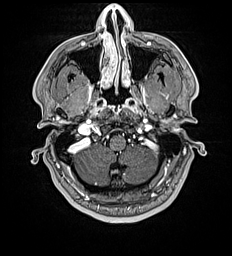
[im 51/176]
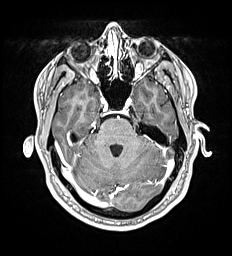
[im 76/176]
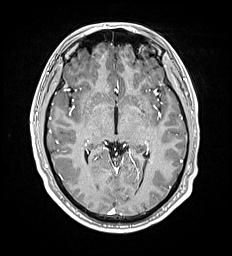
[im 101/176]
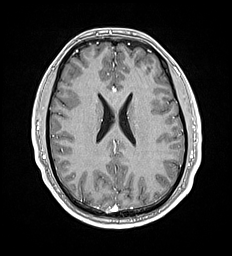
[im 126/176]
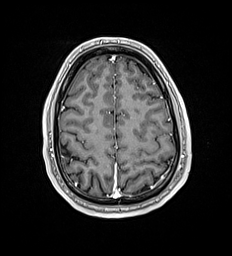
[im 151/176]
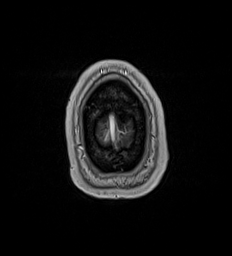
[im 176/176]
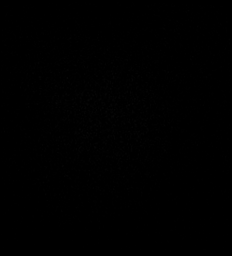

[Series 45: T1 post-contrast · coronal · 5.0mm · 0.57mm/px · 1 of 29 slices shown (2 of 2)]
[im 1/29]
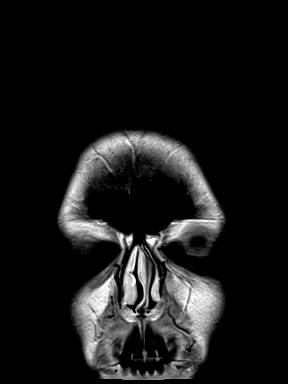

[39 of 48 positions shown; findings below may reference images not displayed]

FINDINGS: MRI HEAD FINDINGS

Brain: No acute infarct, mass effect or extra-axial collection. No
acute or chronic hemorrhage. Normal white matter signal, parenchymal
volume and CSF spaces. The midline structures are normal. There is
no abnormal contrast enhancement.

Vascular: Major flow voids are preserved.

Skull and upper cervical spine: Normal calvarium and skull base.
Visualized upper cervical spine and soft tissues are normal.

Sinuses/Orbits:No paranasal sinus fluid levels or advanced mucosal
thickening. Right maxillary retention cyst. No mastoid or middle ear
effusion. Normal orbits.

MRA HEAD FINDINGS

POSTERIOR CIRCULATION:

--Vertebral arteries: Normal

--Inferior cerebellar arteries: Normal.

--Basilar artery: Normal.

--Superior cerebellar arteries: Normal.

--Posterior cerebral arteries: Normal.

ANTERIOR CIRCULATION:

--Intracranial internal carotid arteries: Normal.

--Anterior cerebral arteries (ACA): Normal.

--Middle cerebral arteries (MCA): Normal.

ANATOMIC VARIANTS: Both P comms are patent.

MRA NECK FINDINGS

Normal carotid and vertebral arteries.  No evidence of dissection.
IMPRESSION: 1. Normal MRI of the brain.
2. Normal MRA of the head and neck.

## 2020-12-06 IMAGING — MR MR CERVICAL SPINE WO/W CM
5 of 8 series · 30 of 48 positions shown · IV contrast (gadavist)
Comparison: None.

CLINICAL DATA: Vision changes.  Neck pain.

EXAM:
MRI CERVICAL SPINE WITHOUT AND WITH CONTRAST
TECHNIQUE: Multiplanar and multiecho pulse sequences of the cervical spine, to
include the craniocervical junction and cervicothoracic junction,
were obtained without and with intravenous contrast.
CONTRAST:  8mL GADAVIST GADOBUTROL 1 MMOL/ML IV SOLN

[Series 18: T2 · sagittal · 3.0mm · 0.62mm/px · 4 of 15 slices shown (1 of 2)]
[im 1/15]
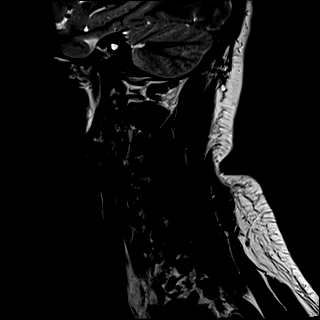
[im 5/15]
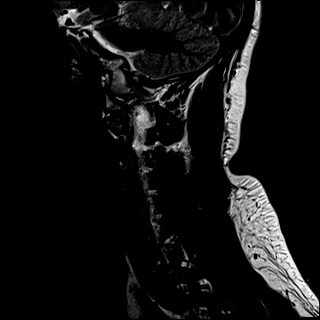
[im 10/15]
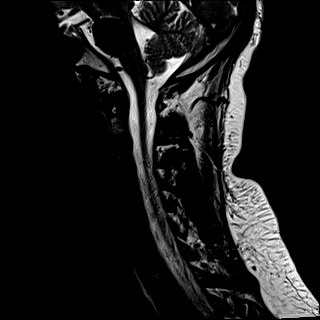
[im 15/15]
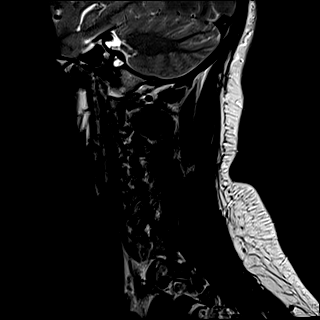

[Series 20: STIR · sagittal · 3.0mm · 0.62mm/px · 4 of 15 slices shown]
[im 1/15]
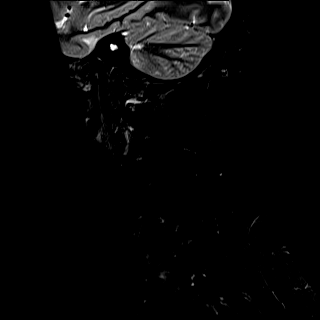
[im 5/15]
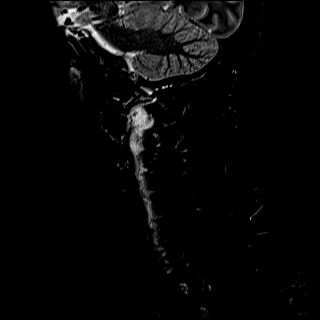
[im 10/15]
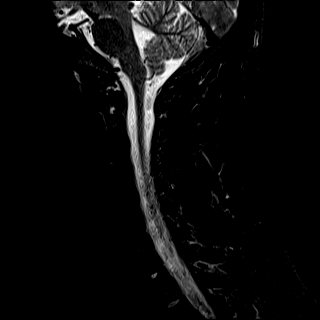
[im 15/15]
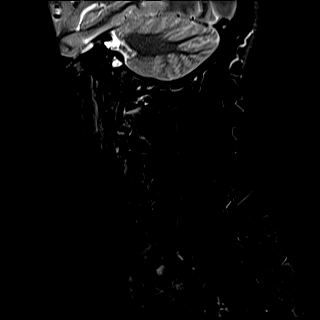

[Series 21: T2 · axial · 3.0mm · 0.70mm/px · z∈[-197,-102]mm · 8 of 29 slices shown (2 of 2)]
[im 1/29]
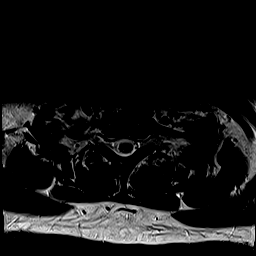
[im 5/29]
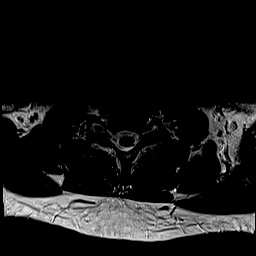
[im 9/29]
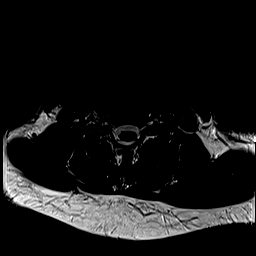
[im 13/29]
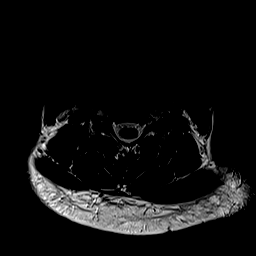
[im 17/29]
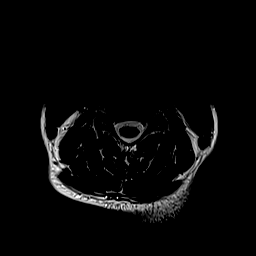
[im 21/29]
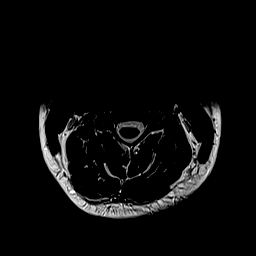
[im 25/29]
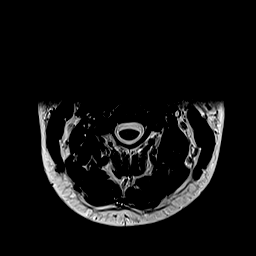
[im 29/29]
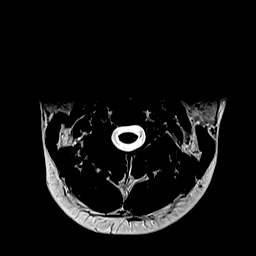

[Series 23: T1 · axial · non-contrast · 3.0mm · 0.35mm/px · z∈[-197,-102]mm · 8 of 29 slices shown]
[im 1/29]
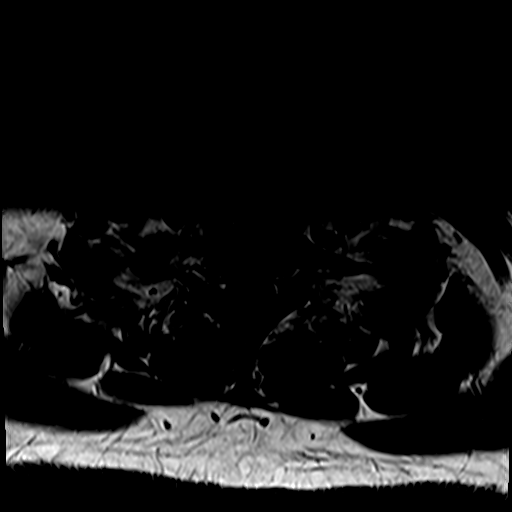
[im 5/29]
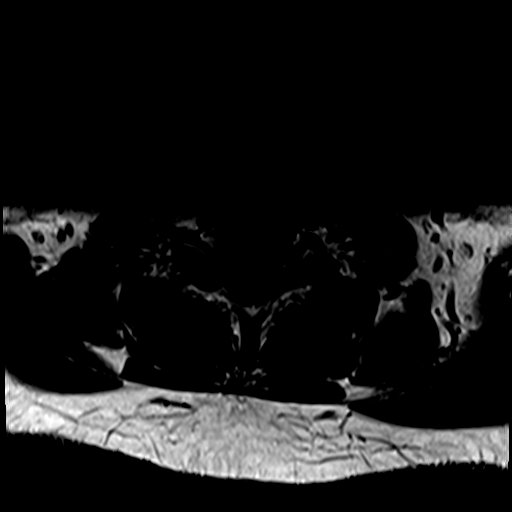
[im 9/29]
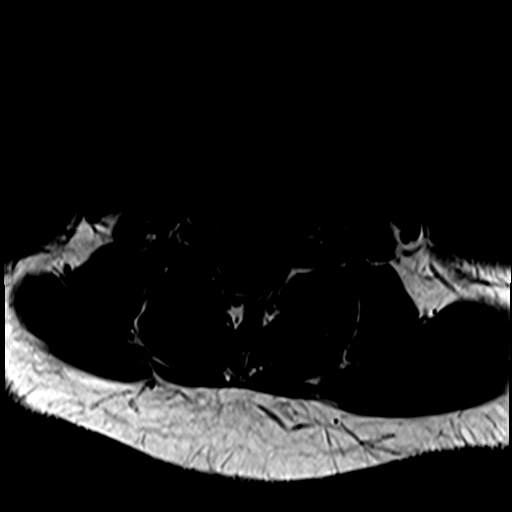
[im 13/29]
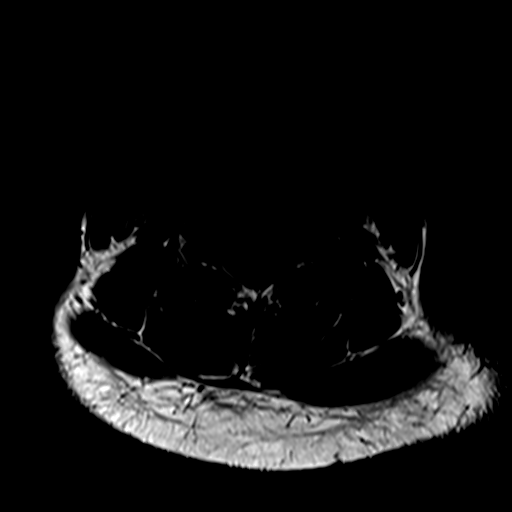
[im 17/29]
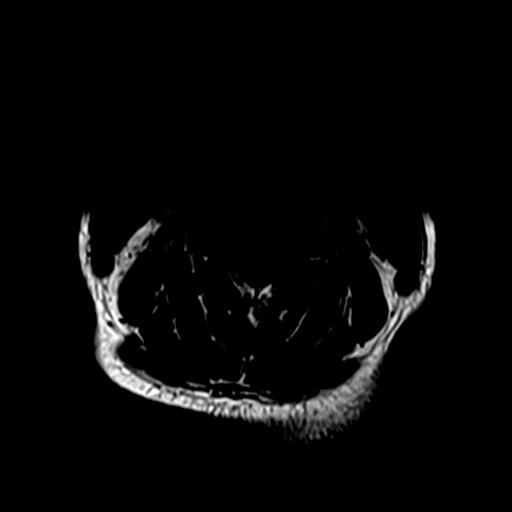
[im 21/29]
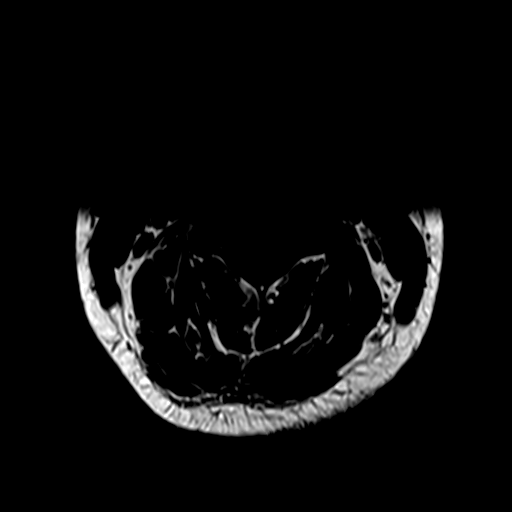
[im 25/29]
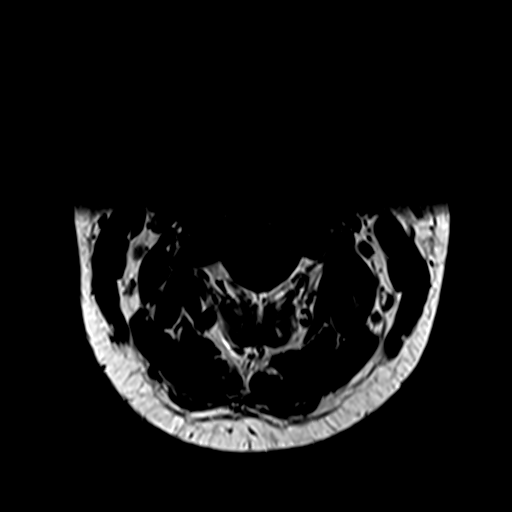
[im 29/29]
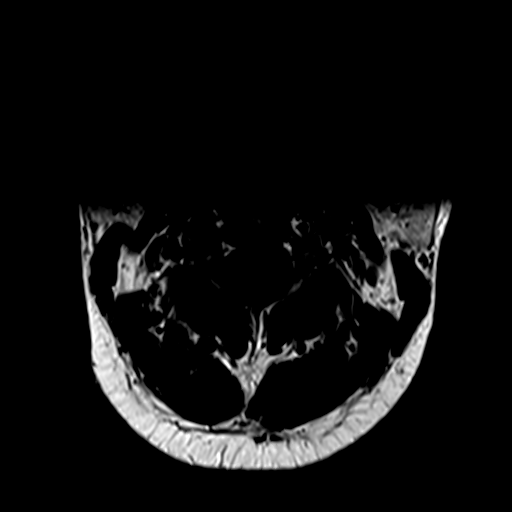

[Series 47: T1 post-contrast · axial · 3.0mm · 0.35mm/px · z∈[-197,-129]mm · 6 of 29 slices shown]
[im 1/29]
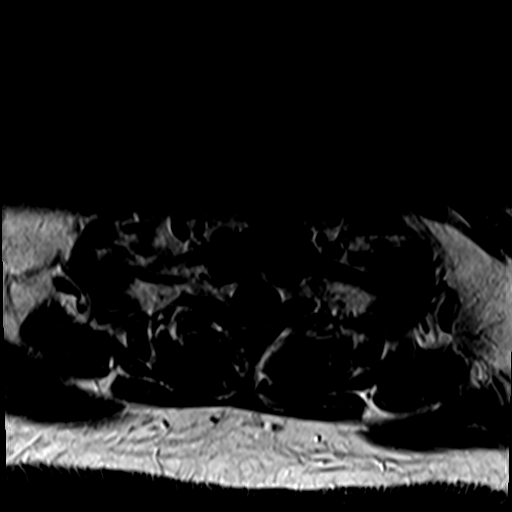
[im 5/29]
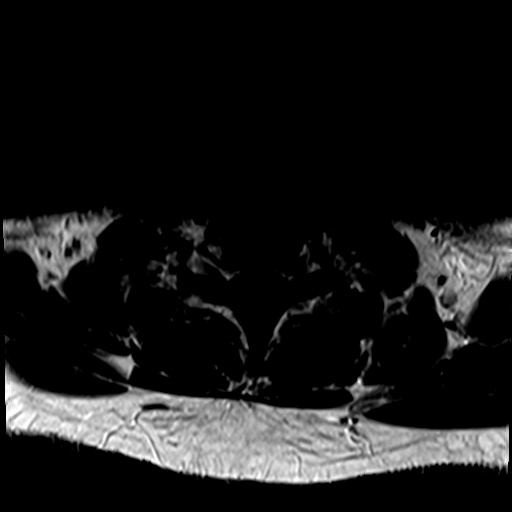
[im 9/29]
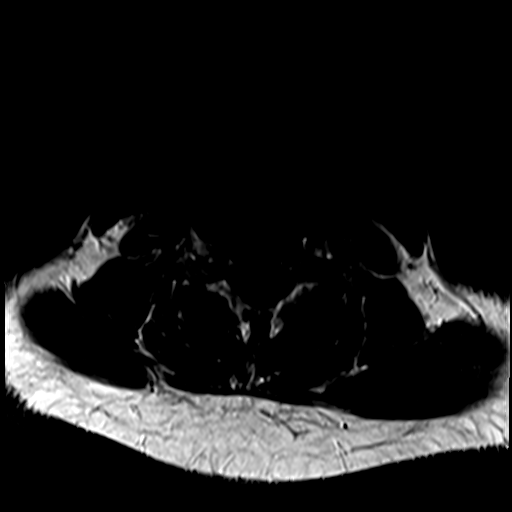
[im 13/29]
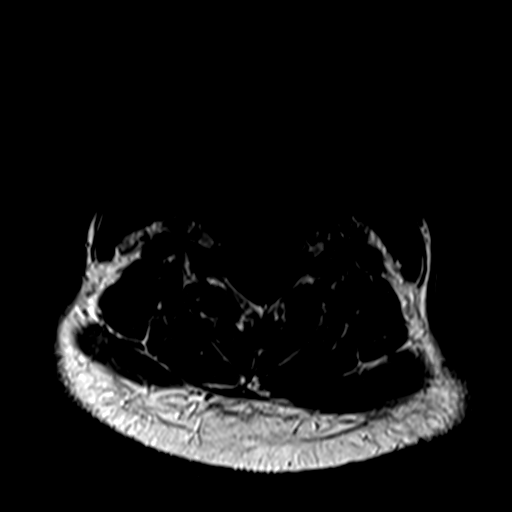
[im 17/29]
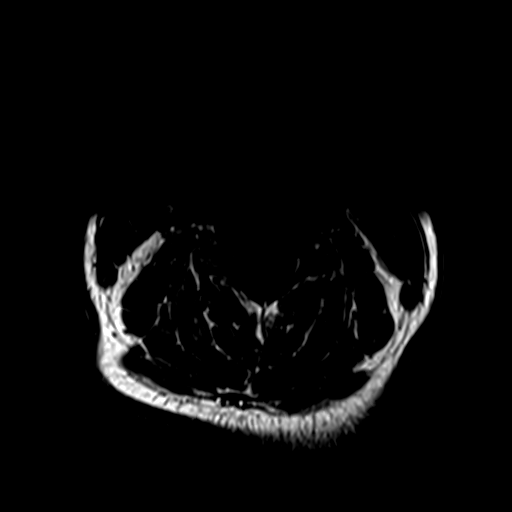
[im 21/29]
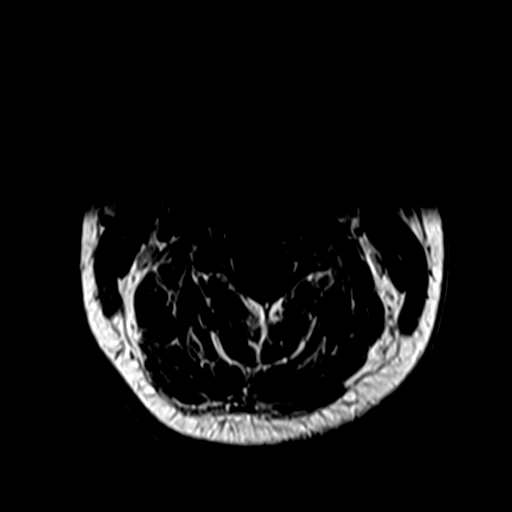

[30 of 48 positions shown; findings below may reference images not displayed]

FINDINGS: Alignment: Physiologic.

Vertebrae: No fracture, evidence of discitis, or bone lesion.

Cord: Normal signal and morphology.

Posterior Fossa, vertebral arteries, paraspinal tissues: Negative.

Disc levels:

No spinal canal or neural foraminal stenosis.
IMPRESSION: Normal MRI of the cervical spine.

## 2020-12-06 IMAGING — MR MR MRA HEAD W/O CM
9 series · 48 of 48 positions shown · IV contrast (gadavist)
Comparison: None.

CLINICAL DATA: Neck pain with blurry vision

EXAM:
MRI HEAD WITHOUT AND WITH CONTRAST
MRA HEAD WITHOUT CONTRAST
MRA NECK WITHOUT AND WITH CONTRAST
TECHNIQUE: Multiplanar, multiecho pulse sequences of the brain and surrounding
structures were obtained without and with intravenous contrast.
Angiographic images of the Circle of Willis were obtained using MRA
technique without intravenous contrast. Angiographic images of the
neck were obtained using MRA technique without and with intravenous
contrast. Carotid stenosis measurements (when applicable) are
obtained utilizing NASCET criteria, using the distal internal
carotid diameter as the denominator.
CONTRAST:  8mL GADAVIST GADOBUTROL 1 MMOL/ML IV SOLN

[Series 5: T1 · sagittal · 5.0mm · 0.62mm/px · 3 of 25 slices shown (1 of 2)]
[im 1/25]
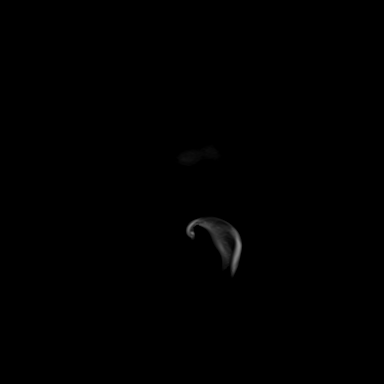
[im 13/25]
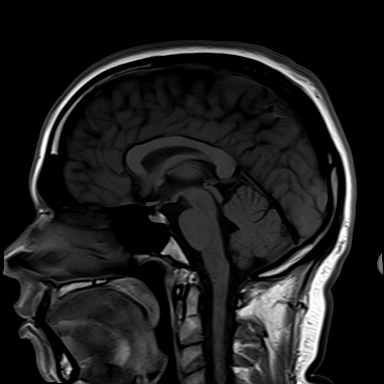
[im 25/25]
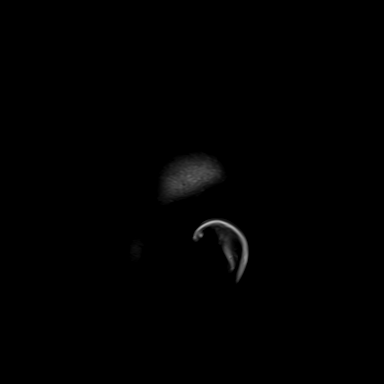

[Series 6: T2 · axial · 5.0mm · 0.53mm/px · z∈[-60,+83]mm · 2 of 25 slices shown (1 of 2)]
[im 1/25]
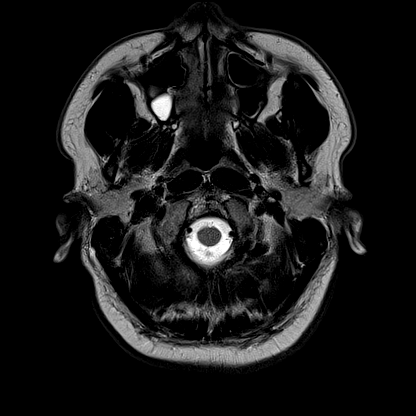
[im 25/25]
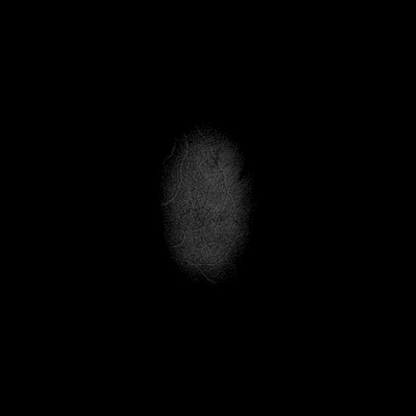

[Series 8: pha_images · axial · 3.0mm · 0.90mm/px · z∈[-77,+90]mm · 4 of 57 slices shown]
[im 1/57]
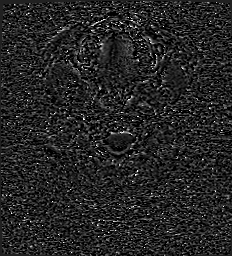
[im 19/57]
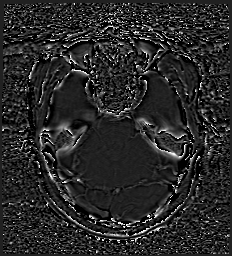
[im 38/57]
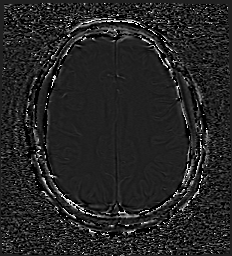
[im 57/57]
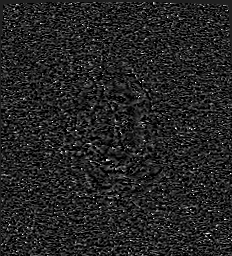

[Series 9: swi_images · axial · 3.0mm · 0.90mm/px · z∈[-77,+99]mm · 5 of 60 slices shown]
[im 1/60]
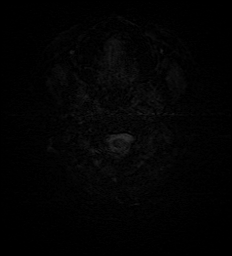
[im 15/60]
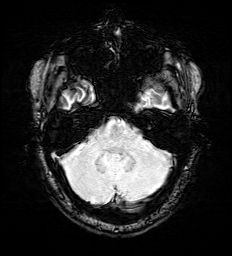
[im 30/60]
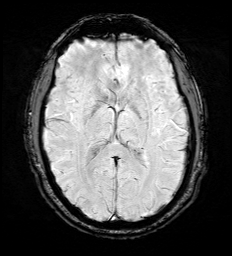
[im 45/60]
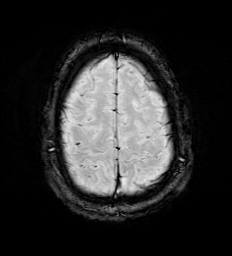
[im 60/60]
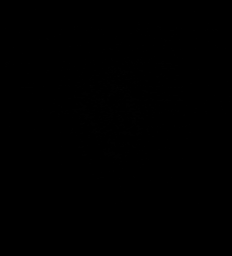

[Series 11: FLAIR · axial · 3.0mm · 0.53mm/px · z∈[-69,+92]mm · 4 of 55 slices shown]
[im 1/55]
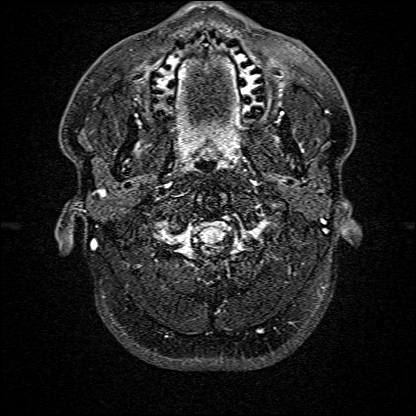
[im 19/55]
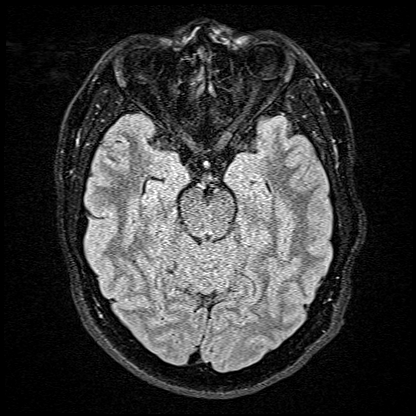
[im 37/55]
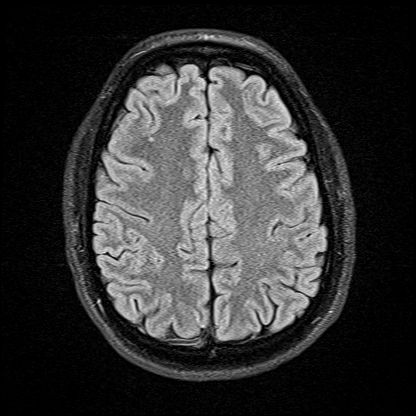
[im 55/55]
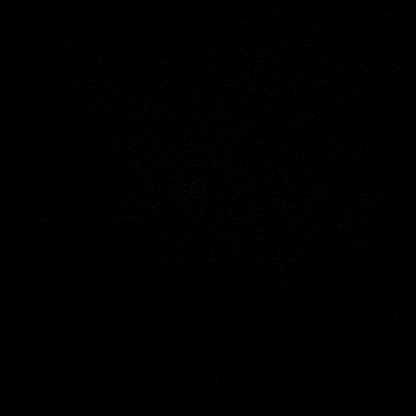

[Series 12: T1 · axial · 1.0mm · 0.98mm/px · z∈[-76,+96]mm · 13 of 174 slices shown (2 of 2)]
[im 1/174]
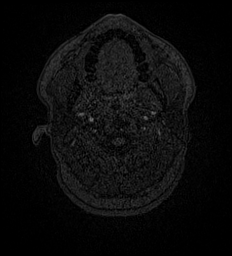
[im 15/174]
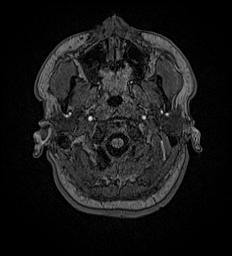
[im 29/174]
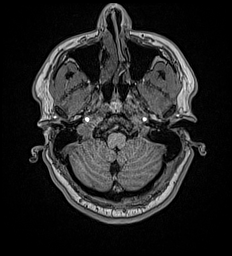
[im 44/174]
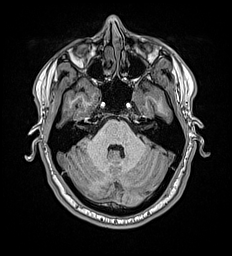
[im 58/174]
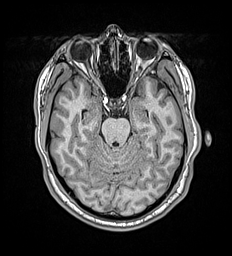
[im 73/174]
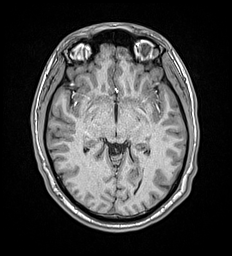
[im 87/174]
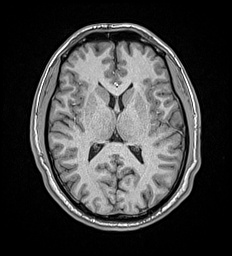
[im 101/174]
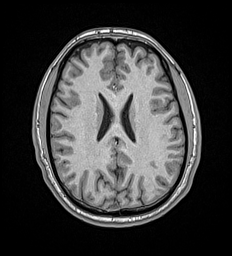
[im 116/174]
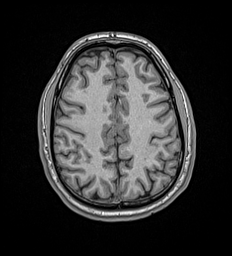
[im 130/174]
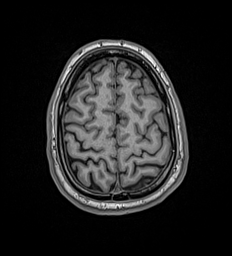
[im 145/174]
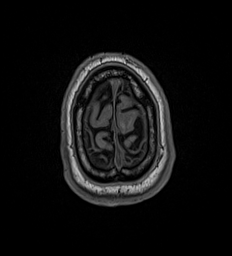
[im 159/174]
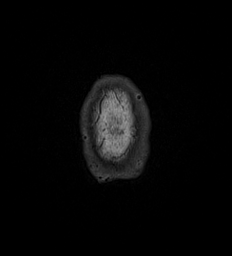
[im 174/174]
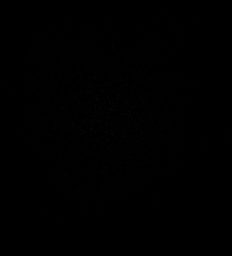

[Series 13: T2 · coronal · 5.0mm · 0.57mm/px · 2 of 29 slices shown (2 of 2)]
[im 1/29]
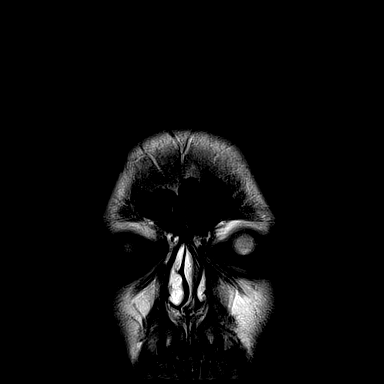
[im 29/29]
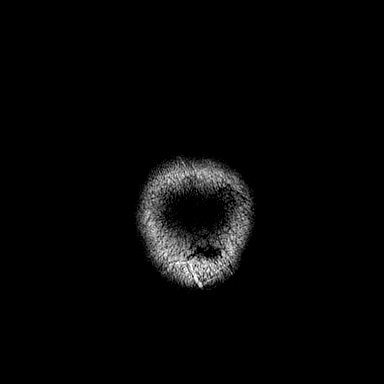

[Series 44: T1 post-contrast · axial · 1.0mm · 0.98mm/px · z∈[-76,+97]mm · 13 of 176 slices shown (1 of 2)]
[im 1/176]
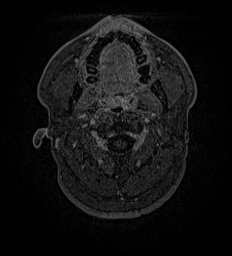
[im 15/176]
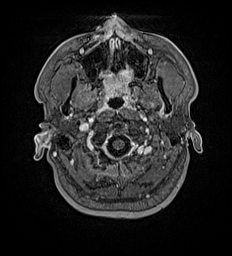
[im 30/176]
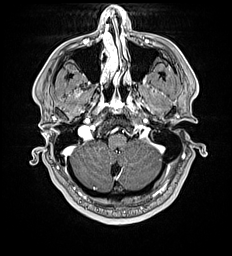
[im 44/176]
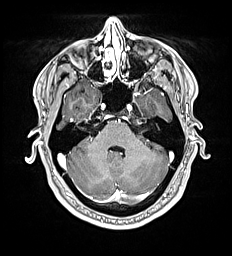
[im 59/176]
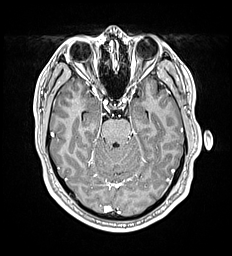
[im 73/176]
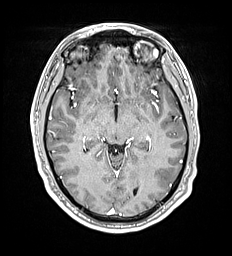
[im 88/176]
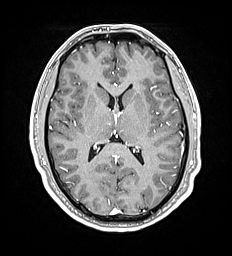
[im 103/176]
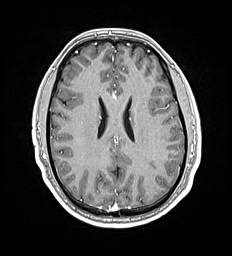
[im 117/176]
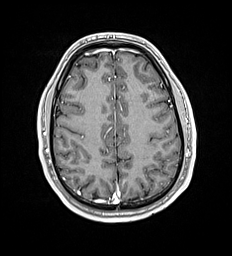
[im 132/176]
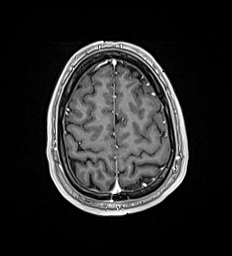
[im 146/176]
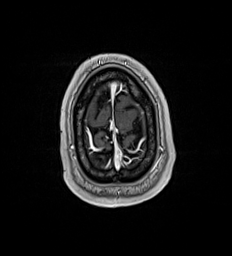
[im 161/176]
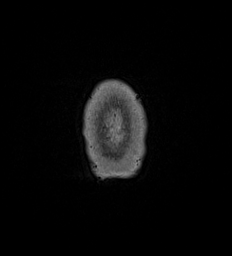
[im 176/176]
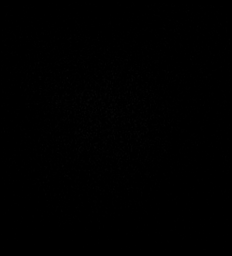

[Series 45: T1 post-contrast · coronal · 5.0mm · 0.57mm/px · 2 of 29 slices shown (2 of 2)]
[im 1/29]
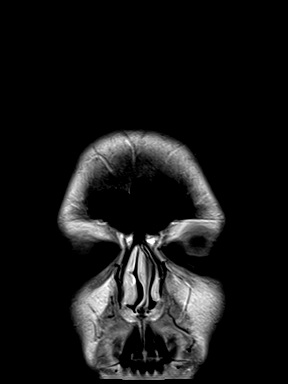
[im 29/29]
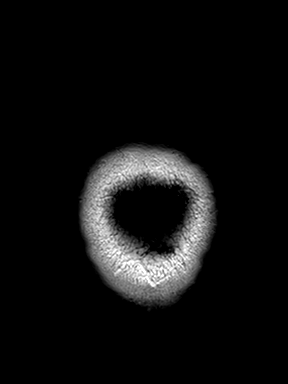

[48 of 48 positions shown; findings below may reference images not displayed]

FINDINGS: MRI HEAD FINDINGS

Brain: No acute infarct, mass effect or extra-axial collection. No
acute or chronic hemorrhage. Normal white matter signal, parenchymal
volume and CSF spaces. The midline structures are normal. There is
no abnormal contrast enhancement.

Vascular: Major flow voids are preserved.

Skull and upper cervical spine: Normal calvarium and skull base.
Visualized upper cervical spine and soft tissues are normal.

Sinuses/Orbits:No paranasal sinus fluid levels or advanced mucosal
thickening. Right maxillary retention cyst. No mastoid or middle ear
effusion. Normal orbits.

MRA HEAD FINDINGS

POSTERIOR CIRCULATION:

--Vertebral arteries: Normal

--Inferior cerebellar arteries: Normal.

--Basilar artery: Normal.

--Superior cerebellar arteries: Normal.

--Posterior cerebral arteries: Normal.

ANTERIOR CIRCULATION:

--Intracranial internal carotid arteries: Normal.

--Anterior cerebral arteries (ACA): Normal.

--Middle cerebral arteries (MCA): Normal.

ANATOMIC VARIANTS: Both P comms are patent.

MRA NECK FINDINGS

Normal carotid and vertebral arteries.  No evidence of dissection.
IMPRESSION: 1. Normal MRI of the brain.
2. Normal MRA of the head and neck.

## 2020-12-06 MED ORDER — LORAZEPAM 2 MG/ML IJ SOLN: 1.0000 mg | INTRAMUSCULAR | Status: DC | PRN

## 2020-12-06 MED ORDER — GADOBUTROL 1 MMOL/ML IV SOLN
8.0000 mL | Freq: Once | INTRAVENOUS | Status: AC | PRN
  Administered 2020-12-06: 8 mL via INTRAVENOUS
  Filled 2020-12-06: qty 8

## 2020-12-06 NOTE — Telephone Encounter (Signed)
We need records from the neurologist he saw in [place].

## 2020-12-06 NOTE — ED Provider Notes (Signed)
[facility] Emergency Department Provider Note   ____________________________________________   Event Date/Time   First MD Initiated Contact with Patient 12/06/20 1857     (approximate)  I have reviewed the triage vital signs and the nursing notes.   HISTORY  Chief Complaint Blurred Vision    HPI Victor Fowler is a 25 y.o. male with past medical history of GERD who presents to the ED for headache and blurry vision.  Patient states that he has been dealing with intermittent blurry vision as well as pain to his posterior neck and head for the past couple of months.  Symptoms have been gradually worsening and he now feels like the blurry vision is more constant.  Blurriness affects both eyes and he states it seems like there is a halo of blurriness in the peripheral vision with a circle pattern.  He denies any double vision and does not have any black areas of his vision.  He has not had any eye pain but endorses pain extending from his occiput down the back of his neck as well as on both sides.  Symptoms seem to be worse when he goes from a position of being bent over to standing straight up.  He also states that he has started to feel weaker in his left arm and left leg over the past month, notices it when he goes to use the clutch in his car.  He has some tingling in his left arm and leg but denies any overt numbness.  He has not had any fevers and denies any nausea or vomiting.  He previously had a normal CT scan and was seen by a neurologist in [facility].  He was told he would need an MRI, but this never got scheduled.        Past Medical History:  Diagnosis Date  . Fatty liver   . Solitary kidney, congenital     Patient Active Problem List   Diagnosis Date Noted  . Gastric intestinal metaplasia, unspecified   . Duodenal erythema   . Acute maxillary sinusitis 09/07/2020  . Ear pain, referred, right 09/07/2020  . Cervical lymphadenopathy 09/07/2020   . Gastroesophageal reflux disease 09/07/2020  . Gastrointestinal tract imaging abnormality   . Nausea, vomiting, and diarrhea   . Rectal inflammation   . Abdominal pain, epigastric   . Columnar epithelial-lined lower esophagus   . Stomach irritation   . Fatty liver 07/22/2017    Past Surgical History:  Procedure Laterality Date  . COLONOSCOPY WITH PROPOFOL N/A 09/30/2017   Procedure: COLONOSCOPY WITH PROPOFOL;  Surgeon: Pasty Spillers, MD;  Location: [facility];  Service: Endoscopy;  Laterality: N/A;  . ESOPHAGOGASTRODUODENOSCOPY (EGD) WITH PROPOFOL N/A 09/30/2017   Procedure: ESOPHAGOGASTRODUODENOSCOPY (EGD) WITH PROPOFOL;  Surgeon: Pasty Spillers, MD;  Location: [facility];  Service: Endoscopy;  Laterality: N/A;  . ESOPHAGOGASTRODUODENOSCOPY (EGD) WITH PROPOFOL N/A 11/09/2020   Procedure: ESOPHAGOGASTRODUODENOSCOPY (EGD) WITH PROPOFOL;  Surgeon: Pasty Spillers, MD;  Location: [facility];  Service: Endoscopy;  Laterality: N/A;  . none      Prior to Admission medications   Not on File    Allergies Watermelon flavor  Family History  Problem Relation Age of Onset  . Cancer Maternal Grandmother        lung cancer, smoked  . Hypertension Maternal Grandfather   . Heart failure Maternal Grandfather   . Cancer Paternal Grandmother        lung (smoked)  .  Diabetes Paternal Grandfather   . Hypertension Paternal Grandfather   . Colon cancer Neg Hx   . Breast cancer Neg Hx     Social History Social History   Tobacco Use  . Smoking status: Never Smoker  . Smokeless tobacco: Never Used  Vaping Use  . Vaping Use: Never used  Substance Use Topics  . Alcohol use: No  . Drug use: No    Review of Systems  Constitutional: No fever/chills Eyes: No visual changes. ENT: No sore throat. Cardiovascular: Denies chest pain. Respiratory: Denies shortness of breath. Gastrointestinal: No abdominal pain.  No nausea, no vomiting.  No diarrhea.  No  constipation. Genitourinary: Negative for dysuria. Musculoskeletal: Negative for back pain.  Positive for neck pain. Skin: Negative for rash. Neurological: Positive for headaches and left-sided weakness with paresthesia.  ____________________________________________   PHYSICAL EXAM:  VITAL SIGNS: ED Triage Vitals  Enc Vitals Group     BP 12/06/20 1806 (!) 161/85     Pulse Rate 12/06/20 1806 94     Resp 12/06/20 1806 16     Temp 12/06/20 1806 98.2 F (36.8 C)     Temp Source 12/06/20 1806 Oral     SpO2 12/06/20 1806 99 %     Weight 12/06/20 1807 195 lb (88.5 kg)     Height 12/06/20 1807 5\' 6"  (1.676 m)     Head Circumference --      Peak Flow --      Pain Score 12/06/20 1807 6     Pain Loc --      Pain Edu? --      Excl. in GC? --     Constitutional: Alert and oriented. Eyes: Conjunctivae are normal.  Pupils equal round and reactive to light bilaterally.  Extraocular movements intact with occasional horizontal but no vertical nystagmus. Head: Atraumatic. Nose: No congestion/rhinnorhea. Mouth/Throat: Mucous membranes are moist. Neck: Normal ROM, midline cervical spine tenderness along with lateral neck tenderness noted. Cardiovascular: Normal rate, regular rhythm. Grossly normal heart sounds. Respiratory: Normal respiratory effort.  No retractions. Lungs CTAB. Gastrointestinal: Soft and nontender. No distention. Genitourinary: deferred Musculoskeletal: No lower extremity tenderness nor edema. Neurologic:  Normal speech and language. No gross focal neurologic deficits are appreciated. Skin:  Skin is warm, dry and intact. No rash noted. Psychiatric: Mood and affect are normal. Speech and behavior are normal.  ____________________________________________   LABS (all labs ordered are listed, but only abnormal results are displayed)  Labs Reviewed  BASIC METABOLIC PANEL - Abnormal; Notable for the following components:      Result Value   Glucose, Bld 101 (*)    All  other components within normal limits  CBC    PROCEDURES  Procedure(s) performed (including Critical Care):  Procedures   ____________________________________________   INITIAL IMPRESSION / ASSESSMENT AND PLAN / ED COURSE       25 year old male with past medical history of GERD presents to the ED with 2 to 3 months of worsening blurry vision associated with posterior headaches and neck pain, now with constant blurry vision and subjective left-sided weakness.  Patient does not appear to have any focal neurologic deficits on exam and eye exam is unremarkable.  He does have some tenderness along both the posterior and lateral portions of his neck.  Previous CT scan from February was negative for acute process, we will further assess with MRI today.  We will also check MRA head and neck to rule out carotid or vertebral artery dissection.  Patient  turned over to oncoming provider pending MRI and MRA.  If these are unremarkable, he would be appropriate for discharge home with neurology follow-up.      ____________________________________________   FINAL CLINICAL IMPRESSION(S) / ED DIAGNOSES  Final diagnoses:  Blurry vision, bilateral  Acute nonintractable headache, unspecified headache type  Neck pain     ED Discharge Orders    None       Note:  This document was prepared using Dragon voice recognition software and may include unintentional dictation errors.   Chesley Noon, MD 12/06/20 2037

## 2020-12-06 NOTE — Discharge Instructions (Addendum)
Please follow-up with the neurologist.  Return to the ER for worsening symptoms, persistent vomiting, lethargy or other concerns.

## 2020-12-06 NOTE — Telephone Encounter (Signed)
Copied from CRM [id number]. Topic: General - Other >> Dec 06, 2020 10:52 AM Pawlus, Maxine Glenn A wrote: Reason for CRM: Pt has [employer] health plans, Pt needs an MRI, has vision issues, blurry vision, Pt wanted a call back from someone at the office. Please advise.

## 2020-12-06 NOTE — Telephone Encounter (Signed)
Patient advised as below. He says that he has contacted the Neurologist from [facility] and has requested that they fax his records to our office.

## 2020-12-06 NOTE — Telephone Encounter (Signed)
Dr. Sherrie Mustache referred patient to Neurologist by his ongoing blurred vision. Was seen by Neurologist referred to at [facility]. Neurologist ordered MRI of head and cervical spine but the patient's insurance ran out so he did not have it done. He now how new insurance and is requesting Dr. Sherrie Mustache for the MRI order locally. He continues to have blurred vision intermittently sometimes with certain head movements, sometimes out of the blue.  He reports dizziness with some instances of the blurred vision episodes. The patient will request the Neurology notes to be faxed to [facility] today.Saw the eye Dr one week ago and received. a normal check up. He does wear glasses but his prescription had not changed at that visit Informed patient we would contact him regarding Dr.Fisher's decision of an appointment with him or call in orders for requested test. Routing to pcp for advice.  Reason for Disposition . [1] Caller requesting NON-URGENT health information AND [2] PCP's office is the best resource  Answer Assessment - Initial Assessment Questions 1. REASON FOR CALL or QUESTION: "What is your reason for calling today?" or "How can I best help you?" or "What question do you have that I can help answer?"   Patient is requesting Dr. Sherrie Mustache for a new order that the Neurologist had requested 2 months ago regarding his chronic blurred vision.  Protocols used: INFORMATION ONLY CALL - NO TRIAGE-A-AH

## 2020-12-06 NOTE — ED Triage Notes (Signed)
Pt reports that he has been having neck issues for the past couple months, states that he has been seen by a neurologist and is waiting on a mri, pt states that the pain is getting worse and states that this week he developed blurred vision that initially was intermittent and states that when he turns his head a certain way it cleared up but then returned, pt also states that he has had some facial weakness this week

## 2020-12-20 ENCOUNTER — Other Ambulatory Visit: Payer: Self-pay

## 2020-12-20 DIAGNOSIS — D508 Other iron deficiency anemias: Secondary | ICD-10-CM

## 2020-12-27 ENCOUNTER — Encounter: Payer: Self-pay | Admitting: Internal Medicine

## 2020-12-27 ENCOUNTER — Other Ambulatory Visit: Payer: Self-pay

## 2020-12-27 ENCOUNTER — Ambulatory Visit (INDEPENDENT_AMBULATORY_CARE_PROVIDER_SITE_OTHER): Payer: 59 | Admitting: Internal Medicine

## 2020-12-27 VITALS — BP 130/80 | HR 74 | Temp 98.0°F | Ht 66.61 in | Wt 189.4 lb

## 2020-12-27 DIAGNOSIS — R42 Dizziness and giddiness: Secondary | ICD-10-CM

## 2020-12-27 LAB — CBC WITH DIFFERENTIAL/PLATELET
Hematocrit: 45.3 % (ref 37.5–51.0)
Hemoglobin: 16.3 g/dL (ref 13.0–17.7)
Lymphocytes Absolute: 1.4 10*3/uL (ref 0.7–3.1)
Lymphs: 23 %
MCH: 32.9 pg (ref 26.6–33.0)
MCHC: 36 g/dL — ABNORMAL HIGH (ref 31.5–35.7)
MCV: 91 fL (ref 79–97)
MID (Absolute): 0.6 10*3/uL (ref 0.1–1.6)
MID: 9 %
Neutrophils Absolute: 4 10*3/uL (ref 1.4–7.0)
Neutrophils: 68 %
Platelets: 272 10*3/uL (ref 150–450)
RBC: 4.96 x10E6/uL (ref 4.14–5.80)
RDW: 13.5 % (ref 11.6–15.4)
WBC: 6 10*3/uL (ref 3.4–10.8)

## 2020-12-27 LAB — URINALYSIS, ROUTINE W REFLEX MICROSCOPIC
Bilirubin, UA: NEGATIVE
Glucose, UA: NEGATIVE
Ketones, UA: NEGATIVE
Leukocytes,UA: NEGATIVE
Nitrite, UA: NEGATIVE
Protein,UA: NEGATIVE
RBC, UA: NEGATIVE
Specific Gravity, UA: 1.025 (ref 1.005–1.030)
Urobilinogen, Ur: 0.2 mg/dL (ref 0.2–1.0)
pH, UA: 5.5 (ref 5.0–7.5)

## 2020-12-27 LAB — BAYER DCA HB A1C WAIVED: HB A1C (BAYER DCA - WAIVED): 5.6 % (ref <7.0)

## 2020-12-27 NOTE — Progress Notes (Signed)
BP 130/80   Pulse 74   Temp 98 F (36.7 C) (Oral)   Ht 5' 6.61" (1.692 m)   Wt 189 lb 6.4 oz (85.9 kg)   SpO2 98%   BMI 30.01 kg/m    Subjective:    Patient ID: Victor Fowler, male    DOB: 08-20-1996, 25 y.o.   MRN: [id number]  HPI: Victor Fowler is a 25 y.o. male  Pt is here to establish care sec to insurance changes.  Says he is here for dizziness / lighheadedness/ blurred vision, feels shaky and weak if he skips meals, is getting worse when he has something sweet has lightheaded - dint have black stools or hematochezia. Sees Gi for GERD/ has had really bad acid reflux. Sees Dr. 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 The current episode started more than 1 year ago.    Chief Complaint  Patient presents with  . New Patient (Initial Visit)    Would like to be tested for DM, has a family hx. He states that if he does not eat he gets shaky, weak, thirsty,blurry vision, and light headed.  . born w/ 1 kidney    Relevant past medical, surgical, family and social history reviewed and updated as indicated. Interim medical history since our last visit reviewed. Allergies and medications reviewed and updated.  Review of Systems  Neurological: Positive for dizziness.    Per HPI unless specifically indicated above     Objective:    BP 130/80   Pulse 74   Temp 98 F (36.7 C) (Oral)   Ht 5' 6.61" (1.692 m)   Wt 189 lb 6.4 oz (85.9 kg)   SpO2 98%   BMI 30.01 kg/m   Wt Readings from Last 3 Encounters:  12/27/20 189 lb 6.4 oz (85.9 kg)  12/06/20 195 lb (88.5 kg)  11/16/20 189 lb (85.7 kg)    Physical Exam Vitals and nursing note reviewed.  Constitutional:      General: He is not in acute distress.    Appearance: Normal appearance. He is not ill-appearing or diaphoretic.  HENT:     Head: Normocephalic and atraumatic.      Right Ear: Tympanic membrane and external ear normal. There is no impacted cerumen.     Left Ear: External ear normal.     Nose: No congestion or rhinorrhea.     Mouth/Throat:     Pharynx: No oropharyngeal exudate or posterior oropharyngeal erythema.  Eyes:     Conjunctiva/sclera: Conjunctivae normal.     Pupils: Pupils are equal, round, and reactive to light.  Cardiovascular:     Rate and Rhythm: Normal rate and regular rhythm.     Heart sounds: No murmur heard. No friction rub. No gallop.   Pulmonary:     Effort: No respiratory distress.     Breath sounds: No stridor. No wheezing or rhonchi.  Chest:     Chest wall: No tenderness.  Abdominal:     General: Abdomen is flat. Bowel sounds are normal.     Palpations: Abdomen is soft. There is no mass.     Tenderness: There is no abdominal tenderness.  Musculoskeletal:     Cervical back: Normal range of motion and neck supple. No rigidity or tenderness.     Left lower leg: No edema.  Skin:  General: Skin is warm and dry.  Neurological:     Mental Status: He is alert.     Results for orders placed or performed during the hospital encounter of 12/06/20  CBC  Result Value Ref Range   WBC 7.8 4.0 - 10.5 K/uL   RBC 4.94 4.22 - 5.81 MIL/uL   Hemoglobin 15.5 13.0 - 17.0 g/dL   HCT 02.5 85.2 - 77.8 %   MCV 88.9 80.0 - 100.0 fL   MCH 31.4 26.0 - 34.0 pg   MCHC 35.3 30.0 - 36.0 g/dL   RDW 24.2 35.3 - 61.4 %   Platelets 296 150 - 400 K/uL   nRBC 0.0 0.0 - 0.2 %  Basic metabolic panel  Result Value Ref Range   Sodium 137 135 - 145 mmol/L   Potassium 3.7 3.5 - 5.1 mmol/L   Chloride 104 98 - 111 mmol/L   CO2 24 22 - 32 mmol/L   Glucose, Bld 101 (H) 70 - 99 mg/dL   BUN 16 6 - 20 mg/dL   Creatinine, Ser 4.31 0.61 - 1.24 mg/dL   Calcium 9.5 8.9 - 54.0 mg/dL   GFR, Estimated >08 >67 mL/min   Anion gap 9 5 - 15       Ct head :  Normal noncontrast CT of the brain.  No current outpatient medications on file.   IMPRESSION: 1.  Normal MRI of the brain. 2. Normal MRA of the head and neck.  Narrative & Impression  CLINICAL DATA:  Neck pain with blurry vision  EXAM: MRI HEAD WITHOUT AND WITH CONTRAST  MRA HEAD WITHOUT CONTRAST  MRA NECK WITHOUT AND WITH CONTRAST  TECHNIQUE: Multiplanar, multiecho pulse sequences of the brain and surrounding structures were obtained without and with intravenous contrast. Angiographic images of the Circle of Willis were obtained using MRA technique without intravenous contrast. Angiographic images of the neck were obtained using MRA technique without and with intravenous contrast. Carotid stenosis measurements (when applicable) are obtained utilizing NASCET criteria, using the distal internal carotid diameter as the denominator.  CONTRAST:  37mL GADAVIST GADOBUTROL 1 MMOL/ML IV SOLN  COMPARISON:  None.  FINDINGS: MRI HEAD FINDINGS  Brain: No acute infarct, mass effect or extra-axial collection. No acute or chronic hemorrhage. Normal white matter signal, parenchymal volume and CSF spaces. The midline structures are normal. There is no abnormal contrast enhancement.  Vascular: Major flow voids are preserved.  Skull and upper cervical spine: Normal calvarium and skull base. Visualized upper cervical spine and soft tissues are normal.  Sinuses/Orbits:No paranasal sinus fluid levels or advanced mucosal thickening. Right maxillary retention cyst. No mastoid or middle ear effusion. Normal orbits.  MRA HEAD FINDINGS  POSTERIOR CIRCULATION:  --Vertebral arteries: Normal  --Inferior cerebellar arteries: Normal.  --Basilar artery: Normal.  --Superior cerebellar arteries: Normal.  --Posterior cerebral arteries: Normal.  ANTERIOR CIRCULATION:  --Intracranial internal carotid arteries: Normal.  --Anterior cerebral arteries (ACA): Normal.  --Middle cerebral arteries (MCA): Normal.  ANATOMIC VARIANTS: Both P comms are patent.  MRA NECK  FINDINGS  Normal carotid and vertebral arteries.  No evidence of dissection.  IMPRESSION: 1. Normal MRI of the brain. 2. Normal MRA of the head and neck.     Assessment & Plan:  1. Dizziness / blurry vision : will check ECHO  Query etiology neuro check done in the ER negative as above with negative MRI MRAs of C-spine of the head.  Patient also has normal carotid arteries per the MRI and MRA. To follow-up with  neurology  [facility] @ may 4th  EKG done 2/22 ? Recheck is not have any palpitations today Check echo - will refer to Cardiology  ? Needs holter monitor.  Check labs including a1c today / CBC a1c wnl and UA -ve waiting on CBC  Orders Placed This Encounter  Procedures  . Bayer DCA Hb A1c Waived  . Lipid panel  . Basic metabolic panel  . Urinalysis, Routine w reflex microscopic  . CBC with Differential/Platelet  . TSH  . Ambulatory referral to Cardiology    Referral Priority:   Urgent    Referral Type:   Consultation    Referral Reason:   Specialty Services Required    Requested Specialty:   Cardiology    Number of Visits Requested:   1  . ECHOCARDIOGRAM COMPLETE    Standing Status:   Future    Standing Expiration Date:   01/26/2021    Order Specific Question:   Where should this test be performed    Answer:   [facility]    Order Specific Question:   Perflutren DEFINITY (image enhancing agent) should be administered unless hypersensitivity or allergy exist    Answer:   Administer Perflutren    Order Specific Question:   Reason for exam-Echo    Answer:   Syncope R55    Problem List Items Addressed This Visit   None      Follow up plan: No follow-ups on file.

## 2020-12-28 LAB — CBC WITH DIFFERENTIAL/PLATELET
Basophils Absolute: 0.1 10*3/uL (ref 0.0–0.2)
Basos: 1 %
EOS (ABSOLUTE): 0.2 10*3/uL (ref 0.0–0.4)
Eos: 3 %
Hematocrit: 45.8 % (ref 37.5–51.0)
Hemoglobin: 16.1 g/dL (ref 13.0–17.7)
Immature Grans (Abs): 0 10*3/uL (ref 0.0–0.1)
Immature Granulocytes: 0 %
Lymphocytes Absolute: 1.5 10*3/uL (ref 0.7–3.1)
Lymphs: 25 %
MCH: 32.2 pg (ref 26.6–33.0)
MCHC: 35.2 g/dL (ref 31.5–35.7)
MCV: 92 fL (ref 79–97)
Monocytes Absolute: 0.4 10*3/uL (ref 0.1–0.9)
Monocytes: 6 %
Neutrophils Absolute: 3.8 10*3/uL (ref 1.4–7.0)
Neutrophils: 65 %
Platelets: 289 10*3/uL (ref 150–450)
RBC: 5 x10E6/uL (ref 4.14–5.80)
RDW: 13 % (ref 11.6–15.4)
WBC: 5.8 10*3/uL (ref 3.4–10.8)

## 2020-12-28 LAB — BASIC METABOLIC PANEL WITH GFR
BUN/Creatinine Ratio: 14 (ref 9–20)
BUN: 14 mg/dL (ref 6–20)
CO2: 20 mmol/L (ref 20–29)
Calcium: 9.8 mg/dL (ref 8.7–10.2)
Chloride: 103 mmol/L (ref 96–106)
Creatinine, Ser: 1.03 mg/dL (ref 0.76–1.27)
Glucose: 90 mg/dL (ref 65–99)
Potassium: 4.3 mmol/L (ref 3.5–5.2)
Sodium: 140 mmol/L (ref 134–144)
eGFR: 103 mL/min/1.73

## 2020-12-28 LAB — TSH: TSH: 1.89 u[IU]/mL (ref 0.450–4.500)

## 2020-12-28 LAB — LIPID PANEL
Chol/HDL Ratio: 3.9 ratio (ref 0.0–5.0)
Cholesterol, Total: 157 mg/dL (ref 100–199)
HDL: 40 mg/dL
LDL Chol Calc (NIH): 90 mg/dL (ref 0–99)
Triglycerides: 152 mg/dL — ABNORMAL HIGH (ref 0–149)
VLDL Cholesterol Cal: 27 mg/dL (ref 5–40)

## 2021-01-01 NOTE — Progress Notes (Signed)
Date:  01/02/2021   ID:  Victor Fowler, DOB 1996-07-25, MRN [id number]  PCP:  Loura Pardon, MD  Cardiologist:  Tessa Lerner, DO, [facility] (established care 01/02/2021)  REASON FOR CONSULT: Dizziness  REQUESTING PHYSICIAN:  Loura Pardon, MD [address] [place],  [place] [address]  Chief Complaint  Patient presents with  . Dizziness  . New Patient (Initial Visit)    Referred by Loura Pardon, MD    HPI  Victor Fowler is a 25 y.o. Caucasian male who presents to the office with a chief complaint of " lightheaded/dizziness, palpitations, shortness of breath."  No significant cardiac history.  He is referred to the office at the request of Vigg, Avanti, MD for evaluation of dizziness.  Patient states that over the last several months he has been experiencing lightheaded and dizziness which is getting progressively worse.  He is already seen a neurologist in [facility] and the work-up was ongoing before a definitive diagnosis could be made.  Has been to an ER twice for further evaluation.  He recently went to [facility] where he had multiple radiological images done reports were reviewed and noted below for further reference.  After recent office visit with his PCP he is referred to cardiology for further evaluation and management.  Patient states that he has been disease for the last 4 months or so and is getting progressively worse.  He states that the dizziness is throughout the day and he has to sit down for the symptoms to resolve.  But notices it shortly thereafter when he starts his physical activity.  Patient states that he is also being evaluated by ENT as well as optometrist for his symptoms of lightheaded and dizziness and blurred vision.  Palpitations: Patient states that every night he wakes up due to palpitations.  Patient states that " my heart is racing 100 miles an hour."  After waking up he has to sit for approximately 10 minutes or so and the symptoms are usually  self-limited.  During these events the patient's lightheadedness and dizziness is worse than baseline.  But denies any near-syncope or syncopal events.  Patient occasionally drinks a soda on every other day basis.  He does not consume coffee, alcohol, recreational drugs, herbal supplements, marijuana, over-the-counter medications or energy drinks, no stimulant medications.  In addition, patient states that he also is short of breath at times.  Patient states that he was short of breath walking in from the parking lot to the office today.  He denies orthopnea, paroxysmal nocturnal dyspnea or lower extremity swelling.  He has not seen pediatric cardiology while growing up.  He used to play baseball during school years and has not had any syncopal events.  No family history of premature coronary disease or sudden cardiac death.  FUNCTIONAL STATUS: No structured exercise program or daily routine.   ALLERGIES: Allergies  Allergen Reactions  . Watermelon Flavor Anaphylaxis    MEDICATION LIST PRIOR TO VISIT: No outpatient medications have been marked as taking for the 01/02/21 encounter (Office Visit) with Odis Hollingshead, Chauntae Hults, DO.     PAST MEDICAL HISTORY: Past Medical History:  Diagnosis Date  . Fatty liver   . Solitary kidney, congenital     PAST SURGICAL HISTORY: Past Surgical History:  Procedure Laterality Date  . COLONOSCOPY WITH PROPOFOL N/A 09/30/2017   Procedure: COLONOSCOPY WITH PROPOFOL;  Surgeon: Pasty Spillers, MD;  Location: [facility];  Service: Endoscopy;  Laterality: N/A;  . ESOPHAGOGASTRODUODENOSCOPY (  EGD) WITH PROPOFOL N/A 09/30/2017   Procedure: ESOPHAGOGASTRODUODENOSCOPY (EGD) WITH PROPOFOL;  Surgeon: Pasty Spillers, MD;  Location: [facility];  Service: Endoscopy;  Laterality: N/A;  . ESOPHAGOGASTRODUODENOSCOPY (EGD) WITH PROPOFOL N/A 11/09/2020   Procedure: ESOPHAGOGASTRODUODENOSCOPY (EGD) WITH PROPOFOL;  Surgeon: Pasty Spillers, MD;   Location: [facility] END[facility];  Service: Endoscopy;  Laterality: N/A;  . none      FAMILY HISTORY: The patient family history includes Anemia in his mother; Cancer in his maternal grandmother and paternal grandmother; Diabetes in his paternal grandfather; Heart failure in his maternal grandfather; Hypertension in his maternal grandfather and paternal grandfather.  SOCIAL HISTORY:  The patient  reports that he has never smoked. He has never used smokeless tobacco. He reports that he does not drink alcohol and does not use drugs.  REVIEW OF SYSTEMS: Review of Systems  Constitutional: Positive for malaise/fatigue. Negative for chills and fever.  HENT: Negative for hoarse voice and nosebleeds.   Eyes: Negative for discharge, double vision and pain.  Cardiovascular: Positive for dyspnea on exertion and palpitations. Negative for chest pain, claudication, leg swelling, near-syncope, orthopnea, paroxysmal nocturnal dyspnea and syncope.  Respiratory: Negative for hemoptysis and shortness of breath.   Musculoskeletal: Negative for muscle cramps and myalgias.  Gastrointestinal: Negative for abdominal pain, constipation, diarrhea, hematemesis, hematochezia, melena, nausea and vomiting.  Neurological: Positive for dizziness and light-headedness.    PHYSICAL EXAM: Vitals with BMI 01/02/2021 12/27/2020 12/27/2020  Height 5\' 6"  - 5' 6.614"  Weight 190 lbs 13 oz - 189 lbs 6 oz  BMI 30.81 - 30.01  Systolic 146 130  Diastolic 93 80 84  Pulse 97 74 82   Orthostatic VS for the past 72 hrs (Last 3 readings):  Orthostatic BP Patient Position BP Location Cuff Size Orthostatic Pulse  01/02/21 1250 (!) 140/94 Standing Left Arm Large 91  01/02/21 1249 (!) 147/94 Sitting Left Arm Large 95  01/02/21 1248 135/84 Supine Left Arm Large 97   CONSTITUTIONAL: Well-developed and well-nourished. No acute distress.  SKIN: Skin is warm and dry. No rash noted. No cyanosis. No pallor. No jaundice HEAD: Normocephalic and  atraumatic.  EYES: No scleral icterus MOUTH/THROAT: Moist oral membranes.  NECK: No JVD present. No thyromegaly noted. No carotid bruits  LYMPHATIC: No visible cervical adenopathy.  CHEST Normal respiratory effort. No intercostal retractions  LUNGS: Clear to auscultation bilaterally.  No stridor. No wheezes. No rales.  CARDIOVASCULAR: Regular rate and rhythm, positive S1-S2, no murmurs rubs or gallops.  ABDOMINAL: No apparent ascites.  EXTREMITIES: No peripheral edema  HEMATOLOGIC: No significant bruising NEUROLOGIC: Oriented to person, place, and time. Nonfocal. Normal muscle tone.  PSYCHIATRIC: Normal mood and affect. Normal behavior. Cooperative  RADIOLOGY: CT head without contrast: 10/12/2020: Normal noncontrast CT of the brain.  MRA neck with and without contrast: 12/06/2020: 1. Normal MRI of the brain. 2. Normal MRA of the head and neck.  MRI brain with and without contrast: 12/06/2020: 1. Normal MRI of the brain. 2. Normal MRA of the head and neck.  MR cervical spine with and without contrast: 12/06/2020: Normal MRI of the cervical spine  CARDIAC DATABASE: EKG: 01/02/2021: Sinus  Rhythm, 93bpm, normal axis, without underlying injury pattern.  Echocardiogram: No results found for this or any previous visit from the past 1095 days.    Stress Testing: No results found for this or any previous visit from the past 1095 days.  Heart Catheterization: None  LABORATORY DATA: CBC Latest Ref Rng & Units 12/27/2020 12/27/2020 12/06/2020  WBC 3.4 - 10.8 x10E3/uL 6.0 5.8 7.8  Hemoglobin 13.0 - 17.7 g/dL 70.4 88.8 91.6  Hematocrit 37.5 - 51.0 % 45.3 45.8 43.9  Platelets 150 - 450 x10E3/uL 272 289 296    CMP Latest Ref Rng & Units 12/27/2020 12/06/2020 11/16/2020  Glucose 65 - 99 mg/dL 90 945(W) -  BUN 6 - 20 mg/dL 14 16 -  Creatinine 3.88 - 1.27 mg/dL 8.28 0.03 -  Sodium 491 - 144 mmol/L 140 137 -  Potassium 3.5 - 5.2 mmol/L 4.3 3.7 -  Chloride 96 - 106 mmol/L 103 104 -  CO2  20 - 29 mmol/L 20 24 -  Calcium 8.7 - 10.2 mg/dL 9.8 9.5 -  Total Protein 6.0 - 8.5 g/dL - - 7.2  Total Bilirubin 0.0 - 1.2 mg/dL - - 0.3  Alkaline Phos 44 - 121 IU/L - - 99  AST 0 - 40 IU/L - - 16  ALT 0 - 44 IU/L - - 34    Lipid Panel     Component Value Date/Time   CHOL 157 12/27/2020 0935   TRIG 152 (H) 12/27/2020 0935   HDL 40 12/27/2020 0935   CHOLHDL 3.9 12/27/2020 0935   LDLCALC 90 12/27/2020 0935   LABVLDL 27 12/27/2020 0935    No components found for: NTPROBNP No results for input(s): PROBNP in the last 8760 hours. Recent Labs    12/27/20 0935  TSH 1.890    BMP Recent Labs    10/12/20 1857 12/06/20 1817 12/27/20 0935  NA 138 137 140  K 3.7 3.7 4.3  CL 104 104 103  CO2 22 24 20   GLUCOSE 116* 101* 90  BUN 16 16 14   CREATININE 0.96 1.08 1.03  CALCIUM 9.7 9.5 9.8  GFRNONAA >60 >60  --     HEMOGLOBIN A1C Lab Results  Component Value Date   HGBA1C 5.6 12/27/2020   MPG 105 07/29/2017    IMPRESSION:    ICD-10-CM   1. Dizziness  R42 EKG 12-Lead  2. Palpitations  R00.2 LONG TERM MONITOR (3-14 DAYS)     RECOMMENDATIONS: CHARON AKAMINE is a 25 y.o. male who presents with symptoms of lightheaded/dizziness, shortness of breath, palpitations in the setting of no prior cardiac history.  Patient states that his symptoms of lightheaded and dizziness initiated approximately 3 to 4 months ago and have been progressively worse.  He has had episodes like these in the past but they are usually not so prolonged.  Since then he has been evaluated by a neurologist in [facility] and has another appointment with [facility] neurology in May 2022.  He is also been at the ER twice since the symptoms started.  When he went to [facility] he had multiple radiological studies performed reports were reviewed and findings noted above.  He recently saw his primary care provider and later was referred to cardiology for further evaluation and  management.  Orthostatic vital signs are negative.  He has an echocardiogram scheduled at the hospital on Jan 08, 2021, results forthcoming.  Palpitations are usually present at night which wake him up.  He does not have any exertional syncope.  Recommended 14-day extended Holter monitor to evaluate for dysrhythmias.  No identifiable reversible causes.  Patient states that he has shortness of breath with effort related activities.  However, on physical examination he is overall euvolemic.  Clinical suspicion for congestive heart failure is low.  We will await the results of the echocardiogram for now.  FINAL  MEDICATION LIST END OF ENCOUNTER: No orders of the defined types were placed in this encounter.   There are no discontinued medications.  No current outpatient medications on file.  Orders Placed This Encounter  Procedures  . LONG TERM MONITOR (3-14 DAYS)  . EKG 12-Lead    There are no Patient Instructions on file for this visit.   --Continue cardiac medications as reconciled in final medication list. --Return in about 5 weeks (around 02/06/2021) for Follow up palpitations, lightheadedness/dizziness, review test results. Or sooner if needed. --Continue follow-up with your primary care physician regarding the management of your other chronic comorbid conditions.  Patient's questions and concerns were addressed to his satisfaction. He voices understanding of the instructions provided during this encounter.   This note was created using a voice recognition software as a result there may be grammatical errors inadvertently enclosed that do not reflect the nature of this encounter. Every attempt is made to correct such errors.  Tessa LernerSunit Mercedes Fort, [place]DO, Ringgold County HospitalFACC  Pager: [phone] Office: [phone]

## 2021-01-02 ENCOUNTER — Other Ambulatory Visit: Payer: Self-pay

## 2021-01-02 ENCOUNTER — Ambulatory Visit: Payer: 59 | Admitting: Cardiology

## 2021-01-02 ENCOUNTER — Encounter: Payer: Self-pay | Admitting: Cardiology

## 2021-01-02 VITALS — BP 146/93 | HR 97 | Temp 98.7°F | Resp 16 | Ht 66.0 in | Wt 190.8 lb

## 2021-01-02 DIAGNOSIS — R002 Palpitations: Secondary | ICD-10-CM

## 2021-01-02 DIAGNOSIS — R42 Dizziness and giddiness: Secondary | ICD-10-CM

## 2021-01-08 ENCOUNTER — Other Ambulatory Visit: Payer: Self-pay

## 2021-01-08 ENCOUNTER — Ambulatory Visit
Admission: RE | Admit: 2021-01-08 | Discharge: 2021-01-08 | Disposition: A | Discharge status: Home / Self Care | Payer: 59 | Source: Ambulatory Visit | Attending: Internal Medicine | Admitting: Internal Medicine

## 2021-01-08 DIAGNOSIS — R42 Dizziness and giddiness: Secondary | ICD-10-CM | POA: Diagnosis present

## 2021-01-08 DIAGNOSIS — R002 Palpitations: Secondary | ICD-10-CM | POA: Insufficient documentation

## 2021-01-08 DIAGNOSIS — R0602 Shortness of breath: Secondary | ICD-10-CM | POA: Diagnosis not present

## 2021-01-08 LAB — ECHOCARDIOGRAM COMPLETE
AR max vel: 2.86 cm2
AV Area VTI: 2.77 cm2
AV Area mean vel: 2.66 cm2
AV Mean grad: 3 mmHg
AV Peak grad: 5 mmHg
Ao pk vel: 1.12 m/s
Area-P 1/2: 4.08 cm2
MV VTI: 2.78 cm2
S' Lateral: 2.8 cm

## 2021-01-08 NOTE — Progress Notes (Signed)
*  PRELIMINARY RESULTS* Echocardiogram 2D Echocardiogram has been performed.  Victor Fowler 01/08/2021, 11:24 AM

## 2021-01-09 ENCOUNTER — Inpatient Hospital Stay: Payer: 59

## 2021-01-09 DIAGNOSIS — R002 Palpitations: Secondary | ICD-10-CM

## 2021-01-28 ENCOUNTER — Ambulatory Visit: Payer: 59

## 2021-01-28 ENCOUNTER — Ambulatory Visit
Admission: EM | Admit: 2021-01-28 | Discharge: 2021-01-28 | Disposition: A | Discharge status: Home / Self Care | Payer: 59 | Attending: Emergency Medicine | Admitting: Emergency Medicine

## 2021-01-28 ENCOUNTER — Encounter: Payer: Self-pay | Admitting: Emergency Medicine

## 2021-01-28 ENCOUNTER — Other Ambulatory Visit: Payer: Self-pay

## 2021-01-28 DIAGNOSIS — R519 Headache, unspecified: Secondary | ICD-10-CM | POA: Diagnosis not present

## 2021-01-28 DIAGNOSIS — G8929 Other chronic pain: Secondary | ICD-10-CM

## 2021-01-28 DIAGNOSIS — R42 Dizziness and giddiness: Secondary | ICD-10-CM | POA: Diagnosis not present

## 2021-01-28 DIAGNOSIS — Q600 Renal agenesis, unilateral: Secondary | ICD-10-CM | POA: Diagnosis not present

## 2021-01-28 DIAGNOSIS — R079 Chest pain, unspecified: Secondary | ICD-10-CM | POA: Diagnosis present

## 2021-01-28 LAB — CBC WITH DIFFERENTIAL/PLATELET
Abs Immature Granulocytes: 0.03 10*3/uL (ref 0.00–0.07)
Basophils Absolute: 0.1 10*3/uL (ref 0.0–0.1)
Basophils Relative: 1 %
Eosinophils Absolute: 0.4 10*3/uL (ref 0.0–0.5)
Eosinophils Relative: 4 %
HCT: 43.6 % (ref 39.0–52.0)
Hemoglobin: 15.3 g/dL (ref 13.0–17.0)
Immature Granulocytes: 0 %
Lymphocytes Relative: 28 %
Lymphs Abs: 2.6 10*3/uL (ref 0.7–4.0)
MCH: 31.5 pg (ref 26.0–34.0)
MCHC: 35.1 g/dL (ref 30.0–36.0)
MCV: 89.7 fL (ref 80.0–100.0)
Monocytes Absolute: 0.8 10*3/uL (ref 0.1–1.0)
Monocytes Relative: 9 %
Neutro Abs: 5.3 10*3/uL (ref 1.7–7.7)
Neutrophils Relative %: 58 %
Platelets: 273 10*3/uL (ref 150–400)
RBC: 4.86 MIL/uL (ref 4.22–5.81)
RDW: 12.3 % (ref 11.5–15.5)
WBC: 9.1 10*3/uL (ref 4.0–10.5)
nRBC: 0 % (ref 0.0–0.2)

## 2021-01-28 LAB — BASIC METABOLIC PANEL
Anion gap: 9 (ref 5–15)
BUN: 17 mg/dL (ref 6–20)
CO2: 22 mmol/L (ref 22–32)
Calcium: 9.1 mg/dL (ref 8.9–10.3)
Chloride: 107 mmol/L (ref 98–111)
Creatinine, Ser: 1.01 mg/dL (ref 0.61–1.24)
GFR, Estimated: >60 mL/min (ref >60)
Glucose, Bld: 131 mg/dL — ABNORMAL HIGH (ref 70–99)
Potassium: 3.6 mmol/L (ref 3.5–5.1)
Sodium: 138 mmol/L (ref 135–145)

## 2021-01-28 LAB — TROPONIN I (HIGH SENSITIVITY): Troponin I (High Sensitivity): 4 ng/L (ref <18)

## 2021-01-28 IMAGING — CR DG CHEST 2V
2 series · 2 of 2 positions shown · non-contrast
Comparison: Chest x-ray [DATE].

CLINICAL DATA: Chest pain. Tingling in upper body and extremities,
pressure on chest feels like he cant breath (one occurrence this
morning), high blood pressure (180/120) since this AM.

EXAM:
CHEST - 2 VIEW

[chest pa]
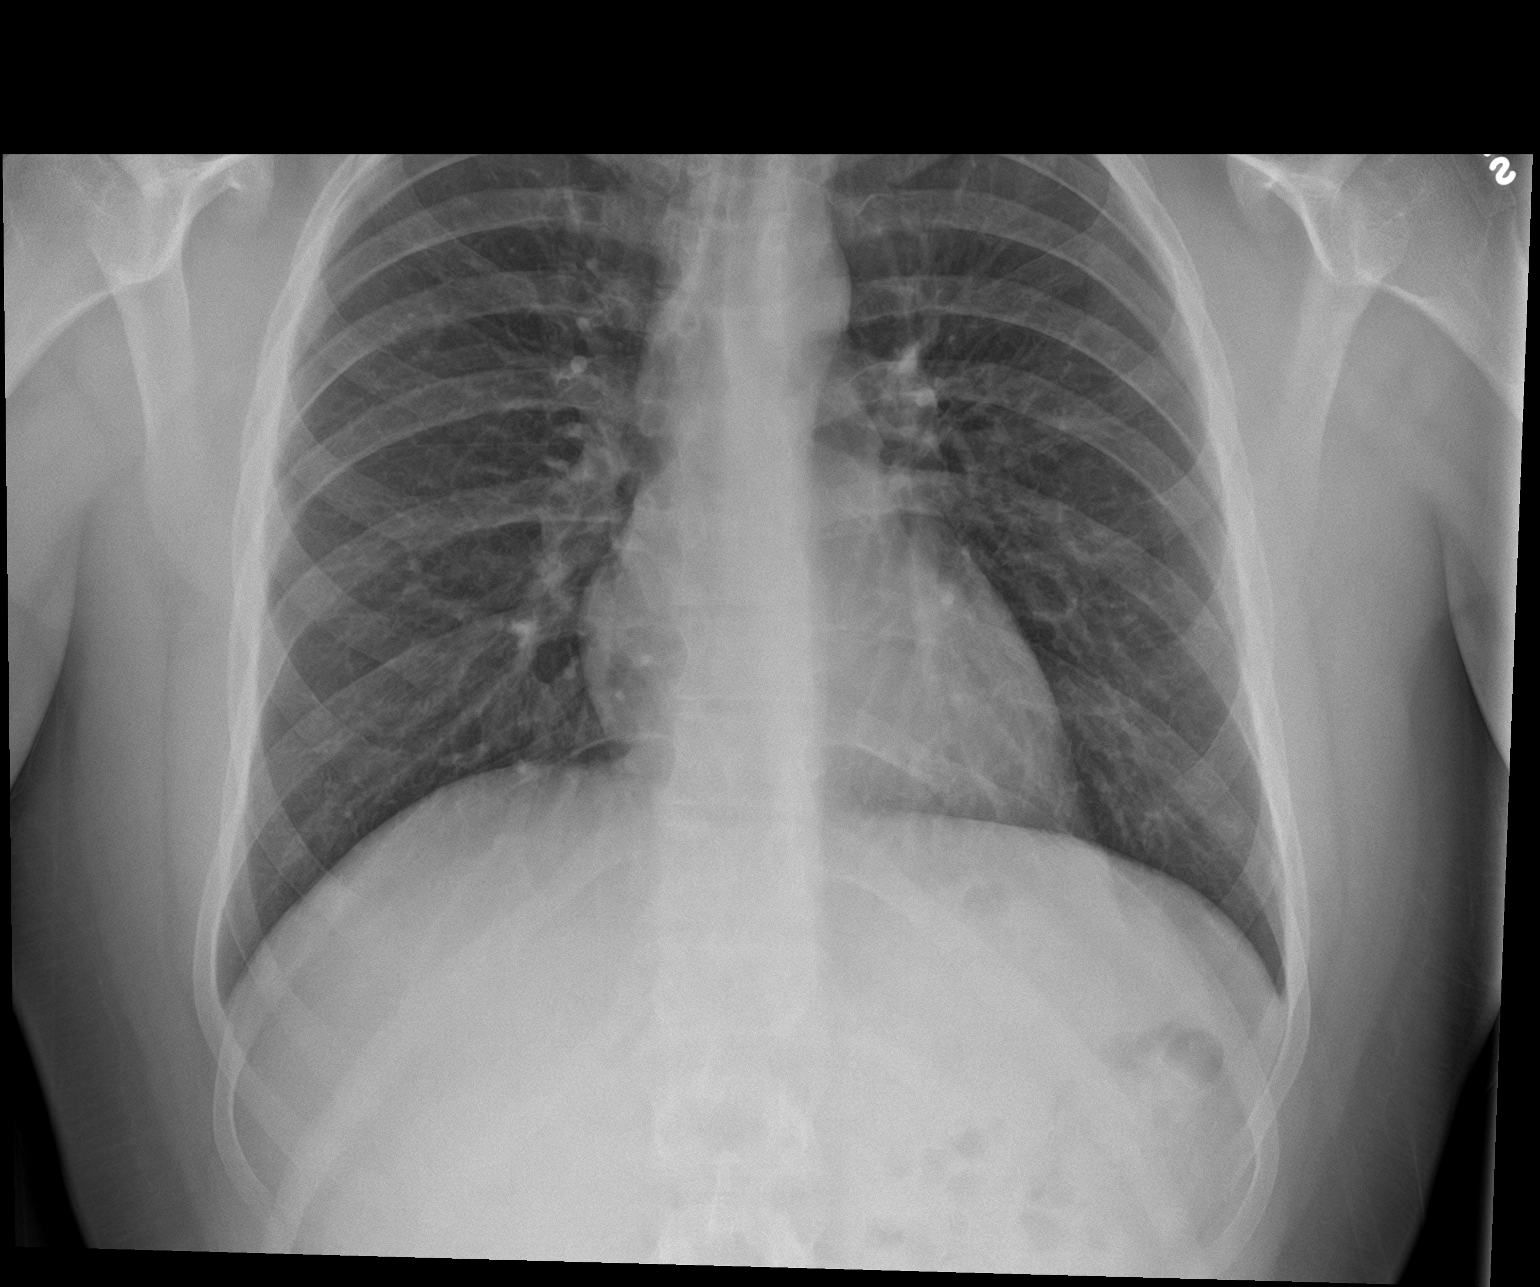

[chest lat]
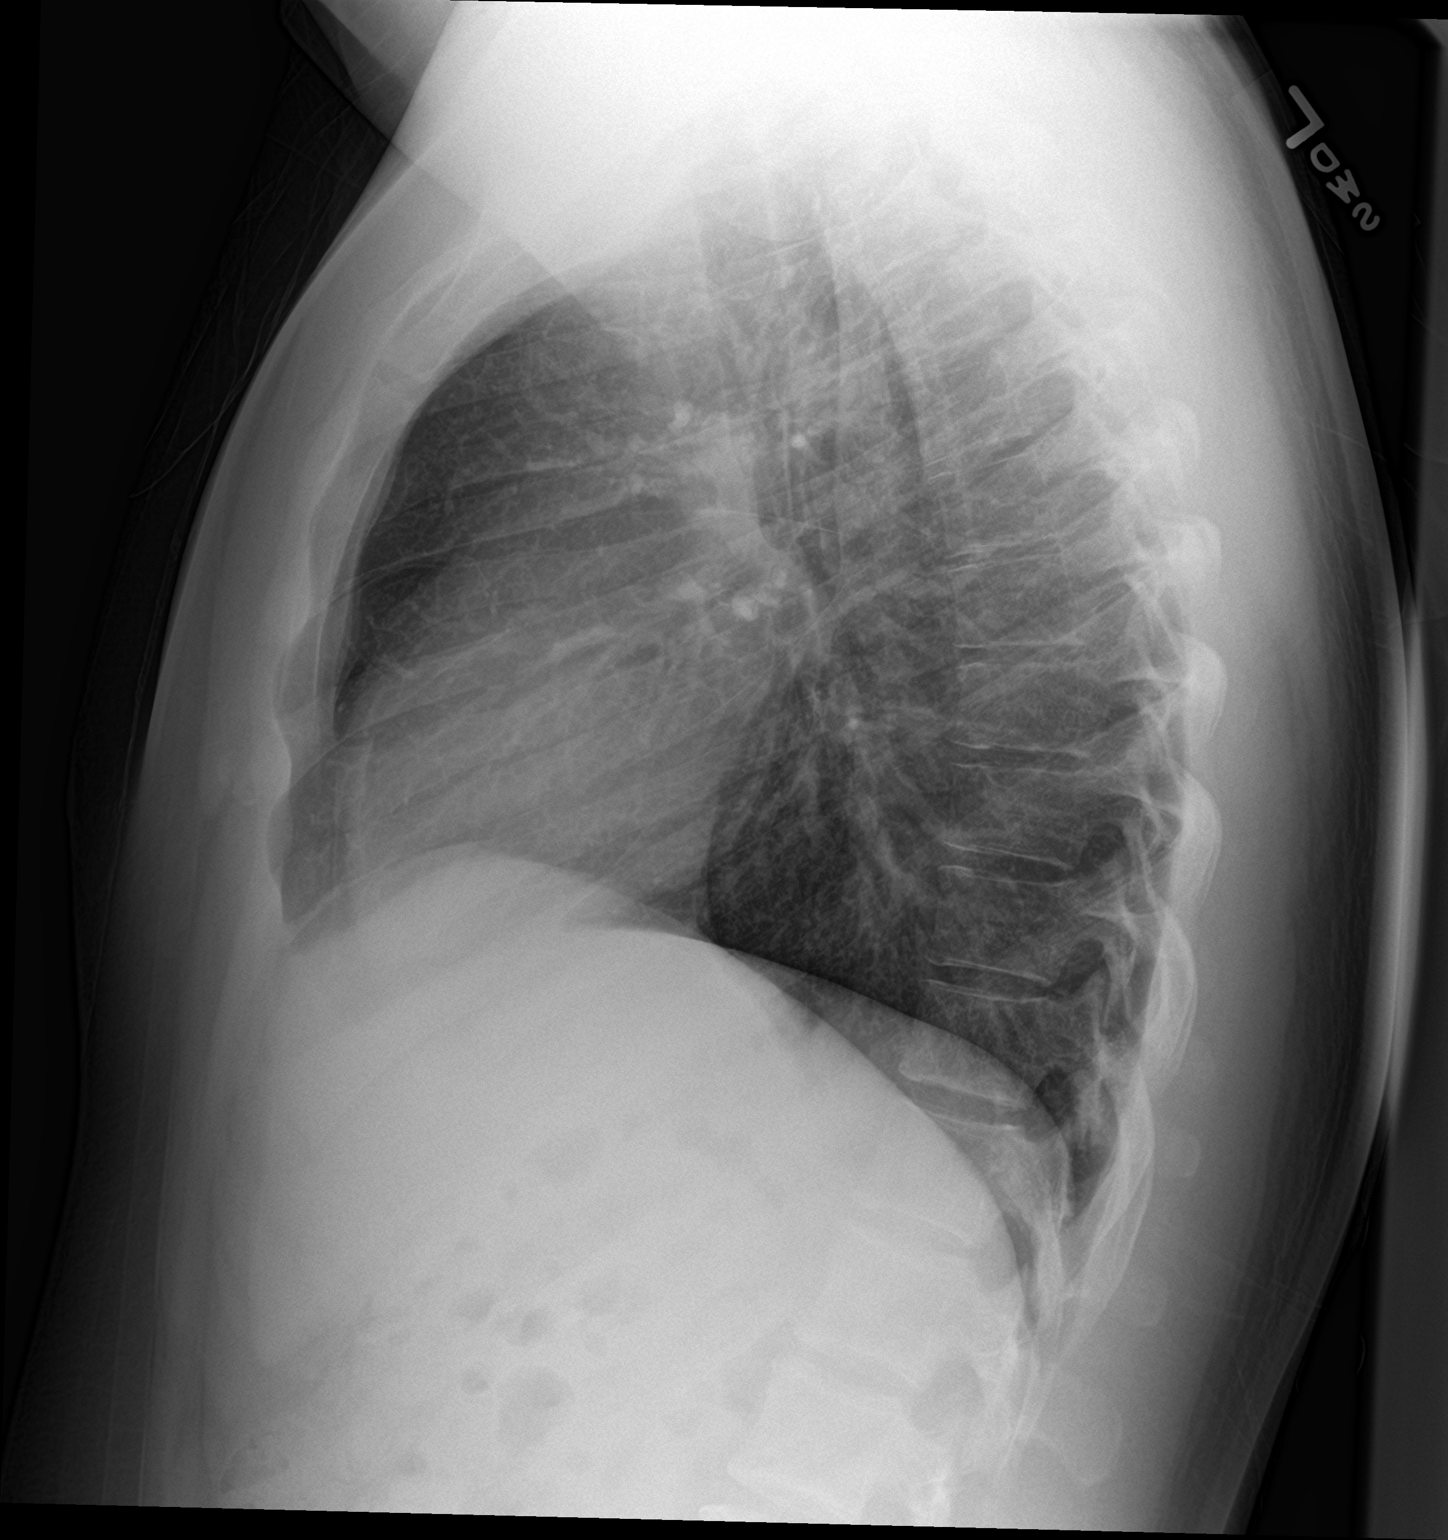

[2 of 2 positions shown; findings below may reference images not displayed]

FINDINGS: The heart size and mediastinal contours are within normal limits.

No focal consolidation. No pulmonary edema. No pleural effusion. No
pneumothorax.

No acute osseous abnormality.
IMPRESSION: No active cardiopulmonary disease.

## 2021-01-28 MED ORDER — MECLIZINE HCL 25 MG PO TABS: 25.0000 mg | ORAL_TABLET | Freq: Three times a day (TID) | ORAL | 0 refills | Status: DC | PRN

## 2021-01-28 MED ORDER — MECLIZINE HCL 25 MG PO TABS
25.0000 mg | ORAL_TABLET | Freq: Once | ORAL | Status: AC
  Administered 2021-01-28: 25 mg via ORAL
  Filled 2021-01-28: qty 1

## 2021-01-28 MED ORDER — IBUPROFEN 800 MG PO TABS
800.0000 mg | ORAL_TABLET | Freq: Once | ORAL | Status: AC
  Administered 2021-01-28: 800 mg via ORAL
  Filled 2021-01-28: qty 1

## 2021-01-28 NOTE — ED Triage Notes (Signed)
Pt states that his face began to tingle all over and felt like all the blood was rushing to his face. He called 911 they took his B/P and it was 186/120 and told him to come to the ER to be evaluated.

## 2021-01-28 NOTE — ED Notes (Signed)
Pt resting at this time. NAD noted. Family at bedside. Call bell in reach. VSS.

## 2021-01-28 NOTE — ED Notes (Signed)
D/C and new RX, and f/up discussed with pt, pt verbalized understanding. NAD noted. VSS. Pt ambulatory on D/C with steady gait.

## 2021-01-28 NOTE — Discharge Instructions (Addendum)
I recommend close follow-up with your primary care doctor and cardiologist.  Your work-up from the ER was reassuring today.  You may alternate Tylenol 1000 mg every 6 hours as needed for pain, fever and Ibuprofen 800 mg every 8 hours as needed for pain, fever.  Please take Ibuprofen with food.  Do not take more than 4000 mg of Tylenol (acetaminophen) in a 24 hour period.

## 2021-01-28 NOTE — ED Provider Notes (Signed)
[facility] Emergency Department Provider Note  ____________________________________________   Event Date/Time   First MD Initiated Contact with Patient 01/28/21 [id number]     (approximate)  I have reviewed the triage vital signs and the nursing notes.   HISTORY  Chief Complaint Tingling    HPI Victor Fowler is a 25 y.o. male with no significant past medical history who presents to the emergency department with complaints of 5 months of intermittent episodes of dizziness that he describes as the room spinning, headaches, blurry vision, palpitations.  States he woke up in the middle of his sleep tonight with shortness of breath which is never happened to him before.  Did have some chest heaviness in the waiting room that has improved.  His family reports that he was shaking all over while he was waiting for EMS but was conscious and alert during this episode.  No seizure-like activity.  He states with EMS his blood pressure was in the 180s/120s.  He states he has been seen by neurology as an outpatient and has had an MRI of his brain which was negative.  States he has also been seen by cardiology and has had a normal echocardiogram with plans to be set up for an outpatient cardiac monitor.  He also has a primary care physician.  Denies any recent fevers, cough, vomiting, diarrhea.  No history of PE, DVT, exogenous estrogen use, recent fractures, surgery, trauma, hospitalization, prolonged travel or other immobilization. No lower extremity swelling or pain. No calf tenderness.  On review of his records, it appears his neurologist has recommended testing for catecholamines and metanephrines by his PCP.  Patient had a normal MRI brain, MRA head and neck on 12/06/2020.  Echocardiogram on 01/08/2021 showed EF of 55 to 60% with normal wall motion abnormalities.  Past Medical History:  Diagnosis Date  . Fatty liver   . Solitary kidney, congenital     Patient Active  Problem List   Diagnosis Date Noted  . Gastric intestinal metaplasia, unspecified   . Duodenal erythema   . Acute maxillary sinusitis 09/07/2020  . Ear pain, referred, right 09/07/2020  . Cervical lymphadenopathy 09/07/2020  . Gastroesophageal reflux disease 09/07/2020  . Gastrointestinal tract imaging abnormality   . Nausea, vomiting, and diarrhea   . Rectal inflammation   . Abdominal pain, epigastric   . Columnar epithelial-lined lower esophagus   . Stomach irritation   . Fatty liver 07/22/2017    Past Surgical History:  Procedure Laterality Date  . COLONOSCOPY WITH PROPOFOL N/A 09/30/2017   Procedure: COLONOSCOPY WITH PROPOFOL;  Surgeon: Pasty Spillers, MD;  Location: [facility];  Service: Endoscopy;  Laterality: N/A;  . ESOPHAGOGASTRODUODENOSCOPY (EGD) WITH PROPOFOL N/A 09/30/2017   Procedure: ESOPHAGOGASTRODUODENOSCOPY (EGD) WITH PROPOFOL;  Surgeon: Pasty Spillers, MD;  Location: [facility];  Service: Endoscopy;  Laterality: N/A;  . ESOPHAGOGASTRODUODENOSCOPY (EGD) WITH PROPOFOL N/A 11/09/2020   Procedure: ESOPHAGOGASTRODUODENOSCOPY (EGD) WITH PROPOFOL;  Surgeon: Pasty Spillers, MD;  Location: [facility];  Service: Endoscopy;  Laterality: N/A;  . none      Prior to Admission medications   Medication Sig Start Date End Date Taking? Authorizing Provider  meclizine (ANTIVERT) 25 MG tablet Take 1 tablet (25 mg total) by mouth 3 (three) times daily as needed for dizziness. 01/28/21  Yes Zondra Lawlor, Layla Maw, DO    Allergies Watermelon flavor  Family History  Problem Relation Age of Onset  . Cancer Maternal Grandmother  lung cancer, smoked  . Hypertension Maternal Grandfather   . Heart failure Maternal Grandfather   . Cancer Paternal Grandmother        lung (smoked)  . Diabetes Paternal Grandfather   . Hypertension Paternal Grandfather   . Anemia Mother   . Colon cancer Neg Hx   . Breast cancer Neg Hx     Social History Social  History   Tobacco Use  . Smoking status: Never Smoker  . Smokeless tobacco: Never Used  Vaping Use  . Vaping Use: Never used  Substance Use Topics  . Alcohol use: No  . Drug use: No    Review of Systems Constitutional: No fever. Eyes: + visual changes. ENT: No sore throat. Cardiovascular: + chest pain. Respiratory: + shortness of breath. Gastrointestinal: No nausea, vomiting, diarrhea. Genitourinary: Negative for dysuria. Musculoskeletal: Negative for back pain. Skin: Negative for rash. Neurological: Negative for focal weakness or numbness.  ____________________________________________   PHYSICAL EXAM:  VITAL SIGNS: ED Triage Vitals  Enc Vitals Group     BP 01/28/21 0107 133/87     Pulse Rate 01/28/21 0104 (!) 104     Resp 01/28/21 0104 20     Temp 01/28/21 0104 98.2 F (36.8 C)     Temp Source 01/28/21 0104 Oral     SpO2 01/28/21 0104 99 %     Weight 01/28/21 0107 190 lb (86.2 kg)     Height 01/28/21 0107 5\' 6"  (1.676 m)     Head Circumference --      Peak Flow --      Pain Score 01/28/21 0107 0     Pain Loc --      Pain Edu? --      Excl. in GC? --    CONSTITUTIONAL: Alert and oriented and responds appropriately to questions. Well-appearing; well-nourished HEAD: Normocephalic EYES: Conjunctivae clear, pupils appear equal, EOM appear intact ENT: normal nose; moist mucous membranes NECK: Supple, normal ROM CARD: RRR; S1 and S2 appreciated; no murmurs, no clicks, no rubs, no gallops RESP: Normal chest excursion without splinting or tachypnea; breath sounds clear and equal bilaterally; no wheezes, no rhonchi, no rales, no hypoxia or respiratory distress, speaking full sentences ABD/GI: Normal bowel sounds; non-distended; soft, non-tender, no rebound, no guarding, no peritoneal signs, no hepatosplenomegaly BACK: The back appears normal EXT: Normal ROM in all joints; no deformity noted, no edema; no cyanosis, no calf tenderness or calf swelling SKIN: Normal color  for age and race; warm; no rash on exposed skin NEURO: Moves all extremities equally, ambulates with normal gait, normal speech, no facial asymmetry PSYCH: The patient's mood and manner are appropriate.  ____________________________________________   LABS (all labs ordered are listed, but only abnormal results are displayed)  Labs Reviewed  BASIC METABOLIC PANEL - Abnormal; Notable for the following components:      Result Value   Glucose, Bld 131 (*)    All other components within normal limits  CBC WITH DIFFERENTIAL/PLATELET  TROPONIN I (HIGH SENSITIVITY)   ____________________________________________  EKG   EKG Interpretation  Date/Time:  Sunday Jan 28 2021 06:50:17 EDT Ventricular Rate:  82 PR Interval:  166 QRS Duration: 91 QT Interval:  362 QTC Calculation: 423 R Axis:   35 Text Interpretation: Sinus rhythm RSR' in V1 or V2, probably normal variant Confirmed by [phone]) on 01/28/2021 7:05:09 AM       ____________________________________________  RADIOLOGY 01/30/2021 Torrey Ballinas, personally viewed and evaluated these images (plain radiographs) as part of  my medical decision making, as well as reviewing the written report by the radiologist.  ED MD interpretation: Chest x-ray clear.  Official radiology report(s): DG Chest 2 View  Result Date: 01/28/2021 CLINICAL DATA:  Chest pain. Tingling in upper body and extremities, pressure on chest feels like he cant breath (one occurrence this morning), high blood pressure (180/120) since this AM. EXAM: CHEST - 2 VIEW COMPARISON:  Chest x-ray 10/12/2020. FINDINGS: The heart size and mediastinal contours are within normal limits. No focal consolidation. No pulmonary edema. No pleural effusion. No pneumothorax. No acute osseous abnormality. IMPRESSION: No active cardiopulmonary disease. Electronically Signed   By: Tish Frederickson M.D.   On: 01/28/2021 06:48     ____________________________________________   PROCEDURES  Procedure(s) performed (including Critical Care):  Procedures   ____________________________________________   INITIAL IMPRESSION / ASSESSMENT AND PLAN / ED COURSE  As part of my medical decision making, I reviewed the following data within the electronic MEDICAL RECORD NUMBER History obtained from family, Nursing notes reviewed and incorporated, Labs reviewed , EKG interpreted , Old chart reviewed, Radiograph reviewed  and Notes from prior ED visits         Patient here with episodes of dizziness, blurry vision, headache, chest pain, shortness of breath, palpitations that have been intermittent for months.  He was hypertensive with EMS but now blood pressure has normalized into the 130s/90s.  Still complaining of headache and vertigo symptoms now.  Initial labs obtained in triage are reassuring with normal electrolytes, renal function and hemoglobin.  We will add on troponin although low suspicion for ACS.  We will add on EKG and chest x-ray today.  He is PERC negative.  Doubt dissection.  No recent infectious symptoms to suggest pericarditis, myocarditis.  ED PROGRESS  EKG reassuring.  No ischemia, interval abnormality, arrhythmia.  Troponin is negative today.  Chest x-ray is clear.  Have advised him to follow-up with his primary care doctor and cardiologist.  Patient and family are comfortable with this plan.   At this time, I do not feel there is any life-threatening condition present. I have reviewed, interpreted and discussed all results (EKG, imaging, lab, urine as appropriate) and exam findings with patient/family. I have reviewed nursing notes and appropriate previous records.  I feel the patient is safe to be discharged home without further emergent workup and can continue workup as an outpatient as needed. Discussed usual and customary return precautions. Patient/family verbalize understanding and are comfortable with  this plan.  Outpatient follow-up has been provided as needed. All questions have been answered.  ____________________________________________   FINAL CLINICAL IMPRESSION(S) / ED DIAGNOSES  Final diagnoses:  Chest pain, unspecified type  Vertigo  Chronic nonintractable headache, unspecified headache type     ED Discharge Orders         Ordered    meclizine (ANTIVERT) 25 MG tablet  3 times daily PRN        01/28/21 0729          *Please note:  Victor Fowler was evaluated in Emergency Department on 01/28/2021 for the symptoms described in the history of present illness. He was evaluated in the context of the global COVID-19 pandemic, which necessitated consideration that the patient might be at risk for infection with the SARS-CoV-2 virus that causes COVID-19. Institutional protocols and algorithms that pertain to the evaluation of patients at risk for COVID-19 are in a state of rapid change based on information released by regulatory bodies including the CDC and  federal and state organizations. These policies and algorithms were followed during the patient's care in the ED.  Some ED evaluations and interventions may be delayed as a result of limited staffing during and the pandemic.*   Note:  This document was prepared using Dragon voice recognition software and may include unintentional dictation errors.   Denyce Harr, Layla MawKristen N, DO 01/28/21 [phone]

## 2021-01-30 ENCOUNTER — Inpatient Hospital Stay: Payer: 59

## 2021-02-14 ENCOUNTER — Ambulatory Visit: Payer: 59 | Admitting: Cardiology

## 2021-05-23 ENCOUNTER — Ambulatory Visit: Payer: Managed Care, Other (non HMO) | Admitting: Gastroenterology

## 2021-12-20 ENCOUNTER — Emergency Department
Admission: EM | Admit: 2021-12-20 | Discharge: 2021-12-21 | Disposition: A | Discharge status: Home / Self Care | Payer: Self-pay | Attending: Emergency Medicine | Admitting: Emergency Medicine

## 2021-12-20 ENCOUNTER — Other Ambulatory Visit: Payer: Self-pay

## 2021-12-20 ENCOUNTER — Emergency Department: Payer: Self-pay

## 2021-12-20 DIAGNOSIS — I10 Essential (primary) hypertension: Secondary | ICD-10-CM | POA: Insufficient documentation

## 2021-12-20 DIAGNOSIS — R42 Dizziness and giddiness: Secondary | ICD-10-CM | POA: Insufficient documentation

## 2021-12-20 DIAGNOSIS — R000 Tachycardia, unspecified: Secondary | ICD-10-CM | POA: Insufficient documentation

## 2021-12-20 DIAGNOSIS — R002 Palpitations: Secondary | ICD-10-CM | POA: Insufficient documentation

## 2021-12-20 DIAGNOSIS — R0789 Other chest pain: Secondary | ICD-10-CM | POA: Insufficient documentation

## 2021-12-20 DIAGNOSIS — R0602 Shortness of breath: Secondary | ICD-10-CM | POA: Insufficient documentation

## 2021-12-20 DIAGNOSIS — R079 Chest pain, unspecified: Secondary | ICD-10-CM

## 2021-12-20 DIAGNOSIS — Z859 Personal history of malignant neoplasm, unspecified: Secondary | ICD-10-CM | POA: Insufficient documentation

## 2021-12-20 LAB — CBG MONITORING, ED: Glucose-Capillary: 125 mg/dL — ABNORMAL HIGH (ref 70–99)

## 2021-12-20 LAB — CBC WITH DIFFERENTIAL/PLATELET
Abs Immature Granulocytes: 0.03 10*3/uL (ref 0.00–0.07)
Basophils Absolute: 0.1 10*3/uL (ref 0.0–0.1)
Basophils Relative: 1 %
Eosinophils Absolute: 0.3 10*3/uL (ref 0.0–0.5)
Eosinophils Relative: 4 %
HCT: 42.8 % (ref 39.0–52.0)
Hemoglobin: 14.8 g/dL (ref 13.0–17.0)
Immature Granulocytes: 0 %
Lymphocytes Relative: 30 %
Lymphs Abs: 2.6 10*3/uL (ref 0.7–4.0)
MCH: 31.4 pg (ref 26.0–34.0)
MCHC: 34.6 g/dL (ref 30.0–36.0)
MCV: 90.7 fL (ref 80.0–100.0)
Monocytes Absolute: 0.7 10*3/uL (ref 0.1–1.0)
Monocytes Relative: 8 %
Neutro Abs: 4.9 10*3/uL (ref 1.7–7.7)
Neutrophils Relative %: 57 %
Platelets: 301 10*3/uL (ref 150–400)
RBC: 4.72 MIL/uL (ref 4.22–5.81)
RDW: 11.9 % (ref 11.5–15.5)
WBC: 8.7 10*3/uL (ref 4.0–10.5)
nRBC: 0 % (ref 0.0–0.2)

## 2021-12-20 LAB — COMPREHENSIVE METABOLIC PANEL
ALT: 32 U/L (ref 0–44)
AST: 23 U/L (ref 15–41)
Albumin: 4.2 g/dL (ref 3.5–5.0)
Alkaline Phosphatase: 84 U/L (ref 38–126)
Anion gap: 7 (ref 5–15)
BUN: 16 mg/dL (ref 6–20)
CO2: 26 mmol/L (ref 22–32)
Calcium: 9.1 mg/dL (ref 8.9–10.3)
Chloride: 108 mmol/L (ref 98–111)
Creatinine, Ser: 1.1 mg/dL (ref 0.61–1.24)
GFR, Estimated: >60 mL/min (ref >60)
Glucose, Bld: 118 mg/dL — ABNORMAL HIGH (ref 70–99)
Potassium: 3.4 mmol/L — ABNORMAL LOW (ref 3.5–5.1)
Sodium: 141 mmol/L (ref 135–145)
Total Bilirubin: 0.4 mg/dL (ref 0.3–1.2)
Total Protein: 7.1 g/dL (ref 6.5–8.1)

## 2021-12-20 LAB — TROPONIN I (HIGH SENSITIVITY): Troponin I (High Sensitivity): 4 ng/L (ref <18)

## 2021-12-20 NOTE — ED Provider Notes (Signed)
? ?[facility] ?Provider Note ? ? ? Event Date/Time  ? First MD Initiated Contact with Patient 12/20/21 2329   ?  (approximate) ? ? ?History  ? ?Chest Pain ? ? ?HPI ? ?Victor Fowler is a 26 y.o. male with past medical history of palpitations and a solitary kidney presents with chest pain and palpitations and dizziness.  Patient notes that for the past year he has had exertional chest pain shortness of breath lightheadedness and palpitations.  Over the last several months it is occurring almost daily.  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  He has been seen by cardiology in the past wore a Holter monitor and has had an echo which was normal.  Recently lost his insurance and has not followed up recently.  Describes tightness in his chest that is severe radiating to the back is typically brought on by exertion but then lasts for an hour even when his exertion stops.  Today was brought on while he was at rest and was more severe than other episodes.  Currently he is pain-free.The patient denies hx of prior DVT/PE, unilateral leg pain/swelling, hormone use, recent surgery, hx of cancer, prolonged immobilization, or hemoptysis.  Has not had syncope. ? ?  ? ?Past Medical History:  ?Diagnosis Date  ? Fatty liver   ? Solitary kidney, congenital   ? ? ?Patient Active Problem List  ? Diagnosis Date Noted  ? Gastric intestinal metaplasia, unspecified   ? Duodenal erythema   ? Acute maxillary sinusitis 09/07/2020  ? Ear pain, referred, right 09/07/2020  ? Cervical lymphadenopathy 09/07/2020  ? Gastroesophageal reflux disease 09/07/2020  ? Gastrointestinal tract imaging abnormality   ? Nausea, vomiting, and diarrhea   ? Rectal inflammation   ? Abdominal pain, epigastric   ? Columnar epithelial-lined lower esophagus   ? Stomach irritation   ? Fatty liver 07/22/2017  ? ? ? ?Physical Exam  ?Triage Vital Signs: ?ED Triage Vitals  ?Enc Vitals Group  ?   BP 12/20/21 2113 (!) 159/86  ?   Pulse Rate 12/20/21  2113 (!) 104  ?   Resp 12/20/21 2113 18  ?   Temp 12/20/21 2113 99 ?F (37.2 ?C)  ?   Temp Source 12/20/21 2113 Oral  ?   SpO2 12/20/21 2113 99 %  ?   Weight 12/20/21 2117 190 lb 11.2 oz (86.5 kg)  ?   Height --   ?   Head Circumference --   ?   Peak Flow --   ?   Pain Score 12/20/21 2117 7  ?   Pain Loc --   ?   Pain Edu? --   ?   Excl. in GC? --   ? ? ?Most recent vital signs: ?Vitals:  ? 12/20/21 2113  ?BP: (!) 159/86  ?Pulse: (!) 104  ?Resp: 18  ?Temp: 99 ?F (37.2 ?C)  ?SpO2: 99%  ? ? ? ?General: Awake, no distress.  ?CV:  Good peripheral perfusion.  No peripheral edema ?Resp:  Normal effort.  Lungs are clear ?Abd:  No distention.  ?Neuro:             Awake, Alert, Oriented x 3  ?Other:   ? ? ?ED Results / Procedures / Treatments  ?Labs ?(all labs ordered are listed, but only abnormal results are displayed) ?Labs Reviewed  ?COMPREHENSIVE METABOLIC PANEL - Abnormal; Notable for the following components:  ?    Result Value  ? Potassium 3.4 (*)   ?  Glucose, Bld 118 (*)   ? All other components within normal limits  ?CBG MONITORING, ED - Abnormal; Notable for the following components:  ? Glucose-Capillary 125 (*)   ? All other components within normal limits  ?CBC WITH DIFFERENTIAL/PLATELET  ?URINALYSIS, ROUTINE W REFLEX MICROSCOPIC  ?TROPONIN I (HIGH SENSITIVITY)  ?TROPONIN I (HIGH SENSITIVITY)  ? ? ? ?EKG ? ?EKG interpreted by myself, sinus tachycardia, normal axis normal intervals no acute ischemic changes ? ? ?RADIOLOGY ?I reviewed the CXR which does not show any acute cardiopulmonary process; agree with radiology report  ? ? ? ?PROCEDURES: ? ?Critical Care performed: No ? ?Procedures ? ? ?MEDICATIONS ORDERED IN ED: ?Medications - No data to display ? ? ?IMPRESSION / MDM / ASSESSMENT AND PLAN / ED COURSE  ?I reviewed the triage vital signs and the nursing notes. ?             ?               ? ?Differential diagnosis includes, but is not limited to, ACS, valvular heart disease, hypertrophic cardiomyopathy,  pulmonary embolism, panic, GERD ? ?Patient is a 26 year old male who presents with over a year of exertional chest pain palpitations dizziness/lightheadedness.  Has seen cardiology and had an echo and has worn a Holter monitor which were negative.  Does not have insurance currently so has not had recent cardiology follow-up.  He is having almost daily chest pain that is typically brought on by exertion however last for an hour and is not necessarily relieved by exertion.  Feels worse at work and also wakes him up at night.  Has not had syncope but does feel lightheaded.  Vital signs are notable for mild tachycardia but saturating 99% he is mildly hypertensive.  Patient appears well on exam he has no lower extremity edema lungs are clear.  Currently has no chest pain.  Reviewed his EKG which does not have ischemic changes.  Troponins x2 are negative.  Overall his exertional symptoms are concerning and I do think that he would benefit from provocative testing.  Will refer to cardiology.  Given he is pain-free with no risk factors and 2 negative troponins do not feel that he requires admission for this.  Also considered pulmonary embolism however with the symptoms coming and going for almost a year I think this would be less likely.  Would also expect him to be symptomatic currently which she has not.  Will refer to cardiology as an outpatient.  Return precautions discussed. ? ?  ? ? ?FINAL CLINICAL IMPRESSION(S) / ED DIAGNOSES  ? ?Final diagnoses:  ?Chest pain, unspecified type  ? ? ? ?Rx / DC Orders  ? ?ED Discharge Orders   ? ? None  ? ?  ? ? ? ?Note:  This document was prepared using Dragon voice recognition software and may include unintentional dictation errors. ?  ?Georga Hacking, MD ?12/21/21 0040 ? ?

## 2021-12-20 NOTE — ED Triage Notes (Signed)
Pt states he has been having these episodes of chest pressure and weakness for the past year. Tonight he had an episode that was more severe. Reports 7/10 chest pain and shortness of breath. Pt states the pain is easing. Step-mother reports that he has been having large swings in his Blood sugar and has been seen at his PCP but no improvement.  ?

## 2021-12-21 LAB — TROPONIN I (HIGH SENSITIVITY): Troponin I (High Sensitivity): 4 ng/L (ref <18)

## 2021-12-21 NOTE — Discharge Instructions (Signed)
Your cardiac enzymes and EKG were all reassuring today.  However I would like you to follow-up with cardiology as you may benefit from a stress test given your exertional symptoms.  Please call the office tomorrow to schedule an appointment.  If you develop pain that is not improving, please return to the emergency department. ?

## 2022-02-22 ENCOUNTER — Ambulatory Visit: Payer: Self-pay

## 2022-02-22 NOTE — Telephone Encounter (Signed)
  Chief Complaint: Low blood sugars, Pt feeling dizzy, fatigue, shaky Symptoms: Ibid Frequency: years Pertinent Negatives: Patient denies  Disposition: [] ED /[] Urgent Care (no appt availability in office) / [x] Appointment(In office/virtual)/ []  [place] [facility]/ [] Home Care/ [] Refused Recommended Disposition /[] [facility]/ []  Follow-up with PCP Additional Notes: Pt has had  these s/s  for years. Pt purchased a glucose meter and has been monitoring blood glucose levels. Pt will keep a few hard candies and small protein food available until OV.   Answer Assessment - Initial Assessment Questions 1. SYMPTOMS: "What symptoms are you concerned about?"     Low blood sugar, light headed, shaky 2. ONSET:  "When did the symptoms start?"     Years - worse now 3. BLOOD GLUCOSE: "What is your blood glucose level?"      Ok today 4. USUAL RANGE: "What is your blood glucose level usually?" (e.g., usual fasting morning value, usual evening value)     unsure 5. TYPE 1 or 2:  "Do you know what type of diabetes you have?"  (e.g., Type 1, Type 2, Gestational; doesn't know)      unknown 6. INSULIN: "Do you take insulin?" "What type of insulin(s) do you use? What is the mode of delivery? (syringe, pen; injection or pump) "When did you last give yourself an insulin dose?" (i.e., time or hours/minutes ago) "How much did you give?" (i.e., how many units)     no 7. DIABETES PILLS: "Do you take any pills for your diabetes?"     no 8. OTHER SYMPTOMS: "Do you have any symptoms?" (e.g., fever, frequent urination, difficulty breathing, vomiting)     Fatigue and SOB after eating 9. LOW BLOOD GLUCOSE TREATMENT: "What have you done so far to treat the low blood glucose level?"     candy 10. FOOD: "When did you last eat or drink?"        11. ALONE: "Are you alone right now or is someone with you?"         12. PREGNANCY: "Is there any chance you are pregnant?" "When was your last menstrual period?"        Na  Protocols used: Diabetes - Low Blood Sugar-A-AH

## 2022-02-26 ENCOUNTER — Ambulatory Visit (INDEPENDENT_AMBULATORY_CARE_PROVIDER_SITE_OTHER): Payer: Self-pay | Admitting: Internal Medicine

## 2022-02-26 ENCOUNTER — Encounter: Payer: Self-pay | Admitting: Internal Medicine

## 2022-02-26 VITALS — BP 137/85 | HR 89 | Temp 98.4°F | Ht 66.61 in | Wt 191.8 lb

## 2022-02-26 DIAGNOSIS — E162 Hypoglycemia, unspecified: Secondary | ICD-10-CM | POA: Insufficient documentation

## 2022-02-26 LAB — BAYER DCA HB A1C WAIVED: HB A1C (BAYER DCA - WAIVED): 5.2 % (ref 4.8–5.6)

## 2022-02-26 LAB — GLUCOSE HEMOCUE WAIVED: Glu Hemocue Waived: 117 mg/dL — ABNORMAL HIGH (ref 70–99)

## 2022-02-26 MED ORDER — LOSARTAN POTASSIUM 25 MG PO TABS: 25.0000 mg | ORAL_TABLET | Freq: Every day | ORAL | 3 refills | Status: DC

## 2022-02-26 NOTE — Progress Notes (Signed)
BP 137/85   Pulse 89   Temp 98.4 F (36.9 C) (Oral)   Ht 5' 6.61" (1.692 m)   Wt 191 lb 12.8 oz (87 kg)   SpO2 97%   BMI 30.39 kg/m    Subjective:    Patient ID: Victor Fowler, male    DOB: 04-Sep-1996, 26 y.o.   MRN: [id number]  Chief Complaint  Patient presents with   low blood sugars    Patient has been taking his glucose readings when he is feeling dizzy or feeling bad. He has noticed that in runs in the 60's at those times    HPI: Victor Fowler is a 26 y.o. male  Dizziness This is a chronic (extensive cardiac work up) problem. Pertinent negatives include no abdominal pain, anorexia, arthralgias, change in bowel habit, chest pain, chills, congestion, coughing, diaphoresis, fatigue, fever, headaches, joint swelling, myalgias, nausea, neck pain, numbness, rash, sore throat, swollen glands, urinary symptoms, vertigo, visual change, vomiting or weakness.    Chief Complaint  Patient presents with   low blood sugars    Patient has been taking his glucose readings when he is feeling dizzy or feeling bad. He has noticed that in runs in the 60's at those times    Relevant past medical, surgical, family and social history reviewed and updated as indicated. Interim medical history since our last visit reviewed. Allergies and medications reviewed and updated.  Review of Systems  Constitutional:  Negative for chills, diaphoresis, fatigue and fever.  HENT:  Negative for congestion and sore throat.   Respiratory:  Negative for cough.   Cardiovascular:  Negative for chest pain.  Gastrointestinal:  Negative for abdominal pain, anorexia, change in bowel habit, nausea and vomiting.  Musculoskeletal:  Negative for arthralgias, joint swelling, myalgias and neck pain.  Skin:  Negative for rash.  Neurological:  Positive for dizziness. Negative for vertigo, weakness, numbness and headaches.    Per HPI unless specifically indicated above     Objective:    BP 137/85   Pulse 89    Temp 98.4 F (36.9 C) (Oral)   Ht 5' 6.61" (1.692 m)   Wt 191 lb 12.8 oz (87 kg)   SpO2 97%   BMI 30.39 kg/m   Wt Readings from Last 3 Encounters:  02/26/22 191 lb 12.8 oz (87 kg)  12/20/21 190 lb 11.2 oz (86.5 kg)  01/28/21 190 lb (86.2 kg)    Physical Exam Vitals and nursing note reviewed.  Constitutional:      General: He is not in acute distress.    Appearance: Normal appearance. He is not ill-appearing or diaphoretic.  HENT:     Head: Normocephalic and atraumatic.     Right Ear: Tympanic membrane and external ear normal. There is no impacted cerumen.     Left Ear: External ear normal.     Nose: No congestion or rhinorrhea.     Mouth/Throat:     Pharynx: No oropharyngeal exudate or posterior oropharyngeal erythema.  Eyes:     Conjunctiva/sclera: Conjunctivae normal.     Pupils: Pupils are equal, round, and reactive to light.  Cardiovascular:     Rate and Rhythm: Normal rate and regular rhythm.     Heart sounds: No murmur heard.    No friction rub. No gallop.  Pulmonary:     Effort: No respiratory distress.     Breath sounds: No stridor. No rhonchi.  Abdominal:     General: Abdomen is flat. Bowel sounds are normal.  Palpations: Abdomen is soft. There is no mass.     Tenderness: There is no abdominal tenderness.  Musculoskeletal:     Cervical back: Normal range of motion and neck supple. No rigidity or tenderness.     Left lower leg: No edema.  Skin:    General: Skin is warm and dry.  Neurological:     Mental Status: He is alert.     Results for orders placed or performed in visit on 02/26/22  Bayer DCA Hb A1c Waived (STAT)  Result Value Ref Range   HB A1C (BAYER DCA - WAIVED) 5.2 4.8 - 5.6 %  Glucose Hemocue Waived  Result Value Ref Range   Glu Hemocue Waived 117 (H) 70 - 99 mg/dL        Current Outpatient Medications:    losartan (COZAAR) 25 MG tablet, Take 1 tablet (25 mg total) by mouth daily., Disp: 30 tablet, Rfl: 3    Assessment & Plan:   Hypoglycemia :  including Holter monitor 03/01/2021 which revealed normal sinus rhythm with mean heart rate of 85 bpm with episodes of sinus tachycardia without significant arrhythmia. 2D echocardiogram 01/08/2021 revealed normal left ventricular function, with LVEF 55 to 60%.   2. HTN: will start pt on losartan for such, Last bp check x 2 weeks ago.  Will need to start checking BP at home.  Continue current meds.  Medication compliance emphasised. pt advised to keep Bp logs. Pt verbalised understanding of the same. Pt to have a low salt diet . Exercise to reach a goal of at least 150 mins a week.  lifestyle modifications explained and pt understands importance of the above. Under good control on current regimen. Continue current regimen. Continue to monitor. Call with any concerns. Refills given. Labs drawn today.   Orders Placed This Encounter  Procedures   Bayer DCA Hb A1c Waived (STAT)   Glucose Hemocue Waived   Ambulatory referral to Endocrinology     Meds ordered this encounter  Medications   losartan (COZAAR) 25 MG tablet    Sig: Take 1 tablet (25 mg total) by mouth daily.    Dispense:  30 tablet    Refill:  3     Follow up plan: No follow-ups on file.

## 2022-02-26 NOTE — Patient Instructions (Signed)
Hypertension, Adult High blood pressure (hypertension) is when the force of blood pumping through the arteries is too strong. The arteries are the blood vessels that carry blood from the heart throughout the body. Hypertension forces the heart to work harder to pump blood and may cause arteries to become narrow or stiff. Untreated or uncontrolled hypertension can lead to a heart attack, heart failure, a stroke, kidney disease, and other problems. A blood pressure reading consists of a higher number over a lower number. Ideally, your blood pressure should be below 120/80. The first ("top") number is called the systolic pressure. It is a measure of the pressure in your arteries as your heart beats. The second ("bottom") number is called the diastolic pressure. It is a measure of the pressure in your arteries as the heart relaxes. What are the causes? The exact cause of this condition is not known. There are some conditions that result in high blood pressure. What increases the risk? Certain factors may make you more likely to develop high blood pressure. Some of these risk factors are under your control, including: Smoking. Not getting enough exercise or physical activity. Being overweight. Having too much fat, sugar, calories, or salt (sodium) in your diet. Drinking too much alcohol. Other risk factors include: Having a personal history of heart disease, diabetes, high cholesterol, or kidney disease. Stress. Having a family history of high blood pressure and high cholesterol. Having obstructive sleep apnea. Age. The risk increases with age. What are the signs or symptoms? High blood pressure may not cause symptoms. Very high blood pressure (hypertensive crisis) may cause: Headache. Fast or irregular heartbeats (palpitations). Shortness of breath. Nosebleed. Nausea and vomiting. Vision changes. Severe chest pain, dizziness, and seizures. How is this diagnosed? This condition is diagnosed by  measuring your blood pressure while you are seated, with your arm resting on a flat surface, your legs uncrossed, and your feet flat on the floor. The cuff of the blood pressure monitor will be placed directly against the skin of your upper arm at the level of your heart. Blood pressure should be measured at least twice using the same arm. Certain conditions can cause a difference in blood pressure between your right and left arms. If you have a high blood pressure reading during one visit or you have normal blood pressure with other risk factors, you may be asked to: Return on a different day to have your blood pressure checked again. Monitor your blood pressure at home for 1 week or longer. If you are diagnosed with hypertension, you may have other blood or imaging tests to help your health care provider understand your overall risk for other conditions. How is this treated? This condition is treated by making healthy lifestyle changes, such as eating healthy foods, exercising more, and reducing your alcohol intake. You may be referred for counseling on a healthy diet and physical activity. Your health care provider may prescribe medicine if lifestyle changes are not enough to get your blood pressure under control and if: Your systolic blood pressure is above 130. Your diastolic blood pressure is above 80. Your personal target blood pressure may vary depending on your medical conditions, your age, and other factors. Follow these instructions at home: Eating and drinking  Eat a diet that is high in fiber and potassium, and low in sodium, added sugar, and fat. An example of this eating plan is called the DASH diet. DASH stands for Dietary Approaches to Stop Hypertension. To eat this way: Eat   plenty of fresh fruits and vegetables. Try to fill one half of your plate at each meal with fruits and vegetables. Eat whole grains, such as whole-wheat pasta, brown rice, or whole-grain bread. Fill about one  fourth of your plate with whole grains. Eat or drink low-fat dairy products, such as skim milk or low-fat yogurt. Avoid fatty cuts of meat, processed or cured meats, and poultry with skin. Fill about one fourth of your plate with lean proteins, such as fish, chicken without skin, beans, eggs, or tofu. Avoid pre-made and processed foods. These tend to be higher in sodium, added sugar, and fat. Reduce your daily sodium intake. Many people with hypertension should eat less than 1,500 mg of sodium a day. Do not drink alcohol if: Your health care provider tells you not to drink. You are pregnant, may be pregnant, or are planning to become pregnant. If you drink alcohol: Limit how much you have to: 0-1 drink a day for women. 0-2 drinks a day for men. Know how much alcohol is in your drink. In the [place] one drink equals one 12 oz bottle of beer (355 mL), one 5 oz glass of wine (148 mL), or one 1 oz glass of hard liquor (44 mL). Lifestyle  Work with your health care provider to maintain a healthy body weight or to lose weight. Ask what an ideal weight is for you. Get at least 30 minutes of exercise that causes your heart to beat faster (aerobic exercise) most days of the week. Activities may include walking, swimming, or biking. Include exercise to strengthen your muscles (resistance exercise), such as Pilates or lifting weights, as part of your weekly exercise routine. Try to do these types of exercises for 30 minutes at least 3 days a week. Do not use any products that contain nicotine or tobacco. These products include cigarettes, chewing tobacco, and vaping devices, such as e-cigarettes. If you need help quitting, ask your health care provider. Monitor your blood pressure at home as told by your health care provider. Keep all follow-up visits. This is important. Medicines Take over-the-counter and prescription medicines only as told by your health care provider. Follow directions carefully. Blood  pressure medicines must be taken as prescribed. Do not skip doses of blood pressure medicine. Doing this puts you at risk for problems and can make the medicine less effective. Ask your health care provider about side effects or reactions to medicines that you should watch for. Contact a health care provider if you: Think you are having a reaction to a medicine you are taking. Have headaches that keep coming back (recurring). Feel dizzy. Have swelling in your ankles. Have trouble with your vision. Get help right away if you: Develop a severe headache or confusion. Have unusual weakness or numbness. Feel faint. Have severe pain in your chest or abdomen. Vomit repeatedly. Have trouble breathing. These symptoms may be an emergency. Get help right away. Call 911. Do not wait to see if the symptoms will go away. Do not drive yourself to the hospital. Summary Hypertension is when the force of blood pumping through your arteries is too strong. If this condition is not controlled, it may put you at risk for serious complications. Your personal target blood pressure may vary depending on your medical conditions, your age, and other factors. For most people, a normal blood pressure is less than 120/80. Hypertension is treated with lifestyle changes, medicines, or a combination of both. Lifestyle changes include losing weight, eating a healthy,   low-sodium diet, exercising more, and limiting alcohol. This information is not intended to replace advice given to you by your health care provider. Make sure you discuss any questions you have with your health care provider. Document Revised: 07/03/2021 Document Reviewed: 07/03/2021 Elsevier Patient Education  2023 [employer] Inc[employer] Blood Pressure Record Sheet To take your blood pressure, you will need a blood pressure machine. You may be prescribed one, or you can buy a blood pressure machine (blood pressure monitor) at your clinic, drug store, or online. When  choosing one, look for these features: An automatic monitor that has an arm cuff. A cuff that wraps snugly, but not too tightly, around your upper arm. You should be able to fit only one finger between your arm and the cuff. A device that stores blood pressure reading results. Do not choose a monitor that measures your blood pressure from your wrist or finger. Follow your health care provider's instructions for how to take your blood pressure. To use this form: Get one reading in the morning (a.m.) before you take any medicines. Get one reading in the evening (p.m.) before supper. Take at least two readings with each blood pressure check. This makes sure the results are correct. Wait 1-2 minutes between measurements. Write down the results in the spaces on this form. Repeat this once a week, or as told by your health care provider. Make a follow-up appointment with your health care provider to discuss the results. Blood pressure log Date: _______________________ a.m. _____________________(1st reading) _____________________(2nd reading) p.m. _____________________(1st reading) _____________________(2nd reading) Date: _______________________ a.m. _____________________(1st reading) _____________________(2nd reading) p.m. _____________________(1st reading) _____________________(2nd reading) Date: _______________________ a.m. _____________________(1st reading) _____________________(2nd reading) p.m. _____________________(1st reading) _____________________(2nd reading) Date: _______________________ a.m. _____________________(1st reading) _____________________(2nd reading) p.m. _____________________(1st reading) _____________________(2nd reading) Date: _______________________ a.m. _____________________(1st reading) _____________________(2nd reading) p.m. _____________________(1st reading) _____________________(2nd reading) This information is not intended to replace advice given to you by your  health care provider. Make sure you discuss any questions you have with your health care provider. Document Revised: 05/10/2021 Document Reviewed: 05/10/2021 Elsevier Patient Education  2023 [employer].

## 2022-03-06 DIAGNOSIS — E162 Hypoglycemia, unspecified: Secondary | ICD-10-CM | POA: Insufficient documentation

## 2022-03-14 NOTE — Progress Notes (Deleted)
Established Patient Office Visit  Name: Victor Fowler   MRN: [id number]    DOB: 1995-11-07   Date:03/14/2022  Today's Provider: Jacquelin Hawking, MHS, PA-C Introduced myself to the patient as a PA-C and provided education on APPs in clinical practice.         Subjective  Chief Complaint  No chief complaint on file.   HPI   Patient Active Problem List   Diagnosis Date Noted   Hypoglycemia 03/06/2022   Low blood sugar reading 02/26/2022   Gastric intestinal metaplasia, unspecified    Duodenal erythema    Acute maxillary sinusitis 09/07/2020   Ear pain, referred, right 09/07/2020   Cervical lymphadenopathy 09/07/2020   Gastroesophageal reflux disease 09/07/2020   Gastrointestinal tract imaging abnormality    Nausea, vomiting, and diarrhea    Rectal inflammation    Abdominal pain, epigastric    Columnar epithelial-lined lower esophagus    Stomach irritation    Fatty liver 07/22/2017    Past Surgical History:  Procedure Laterality Date   COLONOSCOPY WITH PROPOFOL N/A 09/30/2017   Procedure: COLONOSCOPY WITH PROPOFOL;  Surgeon: Pasty Spillers, MD;  Location: [facility];  Service: Endoscopy;  Laterality: N/A;   ESOPHAGOGASTRODUODENOSCOPY (EGD) WITH PROPOFOL N/A 09/30/2017   Procedure: ESOPHAGOGASTRODUODENOSCOPY (EGD) WITH PROPOFOL;  Surgeon: Pasty Spillers, MD;  Location: [facility];  Service: Endoscopy;  Laterality: N/A;   ESOPHAGOGASTRODUODENOSCOPY (EGD) WITH PROPOFOL N/A 11/09/2020   Procedure: ESOPHAGOGASTRODUODENOSCOPY (EGD) WITH PROPOFOL;  Surgeon: Pasty Spillers, MD;  Location: [facility];  Service: Endoscopy;  Laterality: N/A;   none      Family History  Problem Relation Age of Onset   Cancer Maternal Grandmother        lung cancer, smoked   Hypertension Maternal Grandfather    Heart failure Maternal Grandfather    Cancer Paternal Grandmother        lung (smoked)   Diabetes Paternal Grandfather    Hypertension Paternal  Grandfather    Anemia Mother    Colon cancer Neg Hx    Breast cancer Neg Hx     Social History   Tobacco Use   Smoking status: Never   Smokeless tobacco: Never  Substance Use Topics   Alcohol use: No     Current Outpatient Medications:    losartan (COZAAR) 25 MG tablet, Take 1 tablet (25 mg total) by mouth daily., Disp: 30 tablet, Rfl: 3  Allergies  Allergen Reactions   Watermelon Flavor Anaphylaxis    I personally reviewed {Reviewed:14835} with the patient/caregiver today.   ROS    Objective  There were no vitals filed for this visit.  There is no height or weight on file to calculate BMI.  Physical Exam   Recent Results (from the past 2160 hour(s))  CBG monitoring, ED     Status: Abnormal   Collection Time: 12/20/21  9:17 PM  Result Value Ref Range   Glucose-Capillary 125 (H) 70 - 99 mg/dL    Comment: Glucose reference range applies only to samples taken after fasting for at least 8 hours.  CBC with Differential     Status: None   Collection Time: 12/20/21  9:18 PM  Result Value Ref Range   WBC 8.7 4.0 - 10.5 K/uL   RBC 4.72 4.22 - 5.81 MIL/uL   Hemoglobin 14.8 13.0 - 17.0 g/dL   HCT 55.9 74.1 - 63.8 %   MCV 90.7 80.0 - 100.0 fL  MCH 31.4 26.0 - 34.0 pg   MCHC 34.6 30.0 - 36.0 g/dL   RDW 71.6 96.7 - 89.3 %   Platelets 301 150 - 400 K/uL   nRBC 0.0 0.0 - 0.2 %   Neutrophils Relative % 57 %   Neutro Abs 4.9 1.7 - 7.7 K/uL   Lymphocytes Relative 30 %   Lymphs Abs 2.6 0.7 - 4.0 K/uL   Monocytes Relative 8 %   Monocytes Absolute 0.7 0.1 - 1.0 K/uL   Eosinophils Relative 4 %   Eosinophils Absolute 0.3 0.0 - 0.5 K/uL   Basophils Relative 1 %   Basophils Absolute 0.1 0.0 - 0.1 K/uL   Immature Granulocytes 0 %   Abs Immature Granulocytes 0.03 0.00 - 0.07 K/uL    Comment: Performed at [facility], [address] [place], [place] [address]  Comprehensive metabolic panel     Status: Abnormal   Collection Time: 12/20/21  9:18 PM  Result  Value Ref Range   Sodium 141 135 - 145 mmol/L   Potassium 3.4 (L) 3.5 - 5.1 mmol/L   Chloride 108 98 - 111 mmol/L   CO2 26 22 - 32 mmol/L   Glucose, Bld 118 (H) 70 - 99 mg/dL    Comment: Glucose reference range applies only to samples taken after fasting for at least 8 hours.   BUN 16 6 - 20 mg/dL   Creatinine, Ser 5.10 0.61 - 1.24 mg/dL   Calcium 9.1 8.9 - 25.8 mg/dL   Total Protein 7.1 6.5 - 8.1 g/dL   Albumin 4.2 3.5 - 5.0 g/dL   AST 23 15 - 41 U/L   ALT 32 0 - 44 U/L   Alkaline Phosphatase 84 38 - 126 U/L   Total Bilirubin 0.4 0.3 - 1.2 mg/dL   GFR, Estimated >52 >77 mL/min    Comment: (NOTE) Calculated using the CKD-EPI Creatinine Equation (2021)    Anion gap 7 5 - 15    Comment: Performed at [facility], [address] [place], [place] [address]  Troponin I (High Sensitivity)     Status: None   Collection Time: 12/20/21  9:18 PM  Result Value Ref Range   Troponin I (High Sensitivity) 4 <18 ng/L    Comment: (NOTE) Elevated high sensitivity troponin I (hsTnI) values and significant  changes across serial measurements may suggest ACS but many other  chronic and acute conditions are known to elevate hsTnI results.  Refer to the "Links" section for chest pain algorithms and additional  guidance. Performed at [facility], [address] [place], [place] [address]   Troponin I (High Sensitivity)     Status: None   Collection Time: 12/21/21 12:00 AM  Result Value Ref Range   Troponin I (High Sensitivity) 4 <18 ng/L    Comment: (NOTE) Elevated high sensitivity troponin I (hsTnI) values and significant  changes across serial measurements may suggest ACS but many other  chronic and acute conditions are known to elevate hsTnI results.  Refer to the "Links" section for chest pain algorithms and additional  guidance. Performed at [facility], [address] [place], [place] [address]   [facility] Hb A1c Waived (STAT)     Status: None    Collection Time: 02/26/22  2:03 PM  Result Value Ref Range   HB A1C (BAYER DCA - WAIVED) 5.2 4.8 - 5.6 %    Comment:          Prediabetes: 5.7 - 6.4  Diabetes: >6.4          Glycemic control for adults with diabetes: <7.0   Glucose Hemocue Waived     Status: Abnormal   Collection Time: 02/26/22  2:17 PM  Result Value Ref Range   Glu Hemocue Waived 117 (H) 70 - 99 mg/dL     UXN2/3:    5/57/3220    4:10 PM 07/17/2017   11:27 AM  Depression screen PHQ 2/9  Decreased Interest 0 0  Down, Depressed, Hopeless 0 0  PHQ - 2 Score 0 0  Altered sleeping 0 3  Tired, decreased energy 3 0  Change in appetite 0 0  Feeling bad or failure about yourself  0 0  Trouble concentrating 0 0  Moving slowly or fidgety/restless 0 0  Suicidal thoughts 0 0  PHQ-9 Score 3 3  Difficult doing work/chores Not difficult at all Not difficult at all      Fall Risk:    02/26/2022    4:10 PM  Fall Risk   Falls in the past year? 0  Number falls in past yr: 0  Injury with Fall? 0  Risk for fall due to : No Fall Risks  Follow up Falls evaluation completed      Functional Status Survey:      Assessment & Plan

## 2022-03-15 ENCOUNTER — Ambulatory Visit: Payer: Self-pay | Admitting: Physician Assistant

## 2022-05-20 ENCOUNTER — Encounter: Payer: Self-pay | Admitting: Nurse Practitioner

## 2022-05-20 ENCOUNTER — Ambulatory Visit: Payer: Self-pay | Admitting: Nurse Practitioner

## 2022-05-20 VITALS — BP 133/85 | HR 111 | Temp 98.6°F | Wt 180.3 lb

## 2022-05-20 DIAGNOSIS — M545 Low back pain, unspecified: Secondary | ICD-10-CM

## 2022-05-20 MED ORDER — CYCLOBENZAPRINE HCL 5 MG PO TABS: 5.0000 mg | ORAL_TABLET | Freq: Three times a day (TID) | ORAL | 1 refills | Status: DC | PRN

## 2022-05-20 NOTE — Patient Instructions (Signed)
Epidermoid Cyst  An epidermoid cyst, also called an epidermal cyst, is a small lump under your skin. The cyst contains a substance called keratin. Do not try to pop or open the cyst yourself. What are the causes? A blocked hair follicle. A hair that curls and re-enters the skin instead of growing straight out of the skin. A blocked pore. Irritated skin. An injury to the skin. Certain conditions that are passed along from parent to child. Human papillomavirus (HPV). This happens rarely when cysts occur on the bottom of the feet. Long-term sun damage to the skin. What increases the risk? Having acne. Being male. Having an injury to the skin. Being past puberty. Having certain conditions caused by genes (genetic disorder) What are the signs or symptoms? These cysts are usually harmless, but they can get infected. Symptoms of infection may include: Redness. Inflammation. Tenderness. Warmth. Fever. A bad-smelling substance that drains from the cyst. Pus that drains from the cyst. How is this treated? In many cases, epidermoid cysts go away on their own without treatment. If a cyst becomes infected, treatment may include: Opening and draining the cyst, done by a doctor. After draining, you may need minor surgery to remove the rest of the cyst. Antibiotic medicine. Shots of medicines (steroids) that help to reduce inflammation. Surgery to remove the cyst. Surgery may be done if the cyst: Becomes large. Bothers you. Has a chance of turning into cancer. Do not try to open a cyst yourself. Follow these instructions at home: Medicines Take over-the-counter and prescription medicines as told by your doctor. If you were prescribed an antibiotic medicine, take it as told by your doctor. Do not stop taking it even if you start to feel better. General instructions Keep the area around your cyst clean and dry. Wear loose, dry clothing. Avoid touching your cyst. Check your cyst every day  for signs of infection. Check for: Redness, swelling, or pain. Fluid or blood. Warmth. Pus or a bad smell. Keep all follow-up visits. How is this prevented? Wear clean, dry, clothing. Avoid wearing tight clothing. Keep your skin clean and dry. Take showers or baths every day. Contact a doctor if: Your cyst has symptoms of infection. Your condition does not improve or gets worse. You have a cyst that looks different from other cysts you have had. You have a fever. Get help right away if: Redness spreads from the cyst into the area close by. Summary An epidermoid cyst is a small lump under your skin. If a cyst becomes infected, treatment may include surgery to open and drain the cyst, or to remove it. Take over-the-counter and prescription medicines only as told by your doctor. Contact a doctor if your condition is not improving or is getting worse. Keep all follow-up visits. This information is not intended to replace advice given to you by your health care provider. Make sure you discuss any questions you have with your health care provider. Document Revised: 12/01/2019 Document Reviewed: 12/01/2019 Elsevier Patient Education  2023 [employer]  

## 2022-05-20 NOTE — Progress Notes (Signed)
BP 133/85   Pulse (!) 111   Temp 98.6 F (37 C) (Oral)   Wt 180 lb 4.8 oz (81.8 kg)   SpO2 97%   BMI 28.57 kg/m    Subjective:    Patient ID: Victor Fowler, male    DOB: 1996-09-05, 26 y.o.   MRN: [id number]  HPI: Victor Fowler is a 26 y.o. male  Chief Complaint  Patient presents with   Mass    Pt states he has a lump on the left side of his back on the lower side. States he noticed it a few weeks ago, hurts constantly.    LUMP OF SKIN Area to left lower back that is uncomfortable.  Has had back pain in this area for several months, but just noticed lump.   Duration: days Location: left lower back History of trauma in area: no Pain: yes Quality: yes Severity: 8/10 Redness: no Swelling: yes Oozing: no Pus: no Fevers: no Nausea/vomiting: no Status: fluctuating Treatments attempted:warm compresses , ice pack Tetanus: Not UTD   Relevant past medical, surgical, family and social history reviewed and updated as indicated. Interim medical history since our last visit reviewed. Allergies and medications reviewed and updated.  Review of Systems  Constitutional:  Negative for activity change, diaphoresis, fatigue and fever.  Respiratory:  Negative for cough, chest tightness, shortness of breath and wheezing.   Cardiovascular:  Negative for chest pain, palpitations and leg swelling.  Musculoskeletal:  Positive for arthralgias.  Neurological: Negative.   Psychiatric/Behavioral: Negative.      Per HPI unless specifically indicated above     Objective:    BP 133/85   Pulse (!) 111   Temp 98.6 F (37 C) (Oral)   Wt 180 lb 4.8 oz (81.8 kg)   SpO2 97%   BMI 28.57 kg/m   Wt Readings from Last 3 Encounters:  05/20/22 180 lb 4.8 oz (81.8 kg)  02/26/22 191 lb 12.8 oz (87 kg)  12/20/21 190 lb 11.2 oz (86.5 kg)    Physical Exam Vitals and nursing note reviewed.  Constitutional:      General: He is awake. He is not in acute distress.    Appearance: He is  well-developed. He is not ill-appearing or toxic-appearing.  HENT:     Head: Normocephalic and atraumatic.     Right Ear: Hearing normal. No drainage.     Left Ear: Hearing normal. No drainage.  Eyes:     General: Lids are normal.        Right eye: No discharge.        Left eye: No discharge.     Conjunctiva/sclera: Conjunctivae normal.     Pupils: Pupils are equal, round, and reactive to light.  Cardiovascular:     Rate and Rhythm: Normal rate and regular rhythm.     Heart sounds: Normal heart sounds, S1 normal and S2 normal. No murmur heard.    No gallop.  Pulmonary:     Effort: Pulmonary effort is normal. No accessory muscle usage or respiratory distress.     Breath sounds: Normal breath sounds.  Abdominal:     General: Bowel sounds are normal.     Palpations: Abdomen is soft.  Musculoskeletal:        General: Normal range of motion.     Cervical back: Normal range of motion and neck supple.     Right lower leg: No edema.     Left lower leg: No edema.  Skin:  General: Skin is warm and dry.     Capillary Refill: Capillary refill takes less than 2 seconds.       Neurological:     Mental Status: He is alert and oriented to person, place, and time.     Deep Tendon Reflexes: Reflexes are normal and symmetric.     Reflex Scores:      Brachioradialis reflexes are 2+ on the right side and 2+ on the left side.      Patellar reflexes are 2+ on the right side and 2+ on the left side. Psychiatric:        Attention and Perception: Attention normal.        Mood and Affect: Mood normal.        Speech: Speech normal.        Behavior: Behavior normal. Behavior is cooperative.        Thought Content: Thought content normal.     Results for orders placed or performed in visit on 02/26/22  Bayer DCA Hb A1c Waived (STAT)  Result Value Ref Range   HB A1C (BAYER DCA - WAIVED) 5.2 4.8 - 5.6 %  Glucose Hemocue Waived  Result Value Ref Range   Glu Hemocue Waived 117 (H) 70 - 99 mg/dL       Assessment & Plan:   Problem List Items Addressed This Visit       Other   Acute left-sided low back pain without sciatica - Primary    For weeks with small, firm, cyst like mass noted on palpation that is tender to touch.  Will obtain imaging as it is deeper down and not near surface of skin.  At this time recommend Voltaren gel to skin TID and Tylenol as needed + will send in Flexeril to take as needed (educated him not to take while working or driving).  Plan for return after imaging returns to determine next steps for treatment.      Relevant Medications   cyclobenzaprine (FLEXERIL) 5 MG tablet   Other Relevant Orders   [place] LT LOWER EXTREM LTD SOFT TISSUE NON VASCULAR     Follow up plan: Return in about 4 weeks (around 06/17/2022) for Cyst .

## 2022-05-20 NOTE — Assessment & Plan Note (Signed)
For weeks with small, firm, cyst like mass noted on palpation that is tender to touch.  Will obtain imaging as it is deeper down and not near surface of skin.  At this time recommend Voltaren gel to skin TID and Tylenol as needed + will send in Flexeril to take as needed (educated him not to take while working or driving).  Plan for return after imaging returns to determine next steps for treatment.

## 2022-05-23 ENCOUNTER — Emergency Department
Admission: EM | Admit: 2022-05-23 | Discharge: 2022-05-23 | Disposition: A | Discharge status: Home / Self Care | Payer: Self-pay | Attending: Emergency Medicine | Admitting: Emergency Medicine

## 2022-05-23 ENCOUNTER — Other Ambulatory Visit: Payer: Self-pay

## 2022-05-23 ENCOUNTER — Emergency Department: Payer: Self-pay

## 2022-05-23 DIAGNOSIS — S39012A Strain of muscle, fascia and tendon of lower back, initial encounter: Secondary | ICD-10-CM

## 2022-05-23 DIAGNOSIS — Y990 Civilian activity done for income or pay: Secondary | ICD-10-CM | POA: Insufficient documentation

## 2022-05-23 DIAGNOSIS — M542 Cervicalgia: Secondary | ICD-10-CM | POA: Insufficient documentation

## 2022-05-23 DIAGNOSIS — R222 Localized swelling, mass and lump, trunk: Secondary | ICD-10-CM

## 2022-05-23 DIAGNOSIS — X500XXA Overexertion from strenuous movement or load, initial encounter: Secondary | ICD-10-CM | POA: Insufficient documentation

## 2022-05-23 DIAGNOSIS — M545 Low back pain, unspecified: Secondary | ICD-10-CM

## 2022-05-23 DIAGNOSIS — R109 Unspecified abdominal pain: Secondary | ICD-10-CM | POA: Insufficient documentation

## 2022-05-23 DIAGNOSIS — M5432 Sciatica, left side: Secondary | ICD-10-CM

## 2022-05-23 LAB — BASIC METABOLIC PANEL
Anion gap: 7 (ref 5–15)
BUN: 17 mg/dL (ref 6–20)
CO2: 26 mmol/L (ref 22–32)
Calcium: 9.5 mg/dL (ref 8.9–10.3)
Chloride: 108 mmol/L (ref 98–111)
Creatinine, Ser: 0.91 mg/dL (ref 0.61–1.24)
GFR, Estimated: >60 mL/min (ref >60)
Glucose, Bld: 129 mg/dL — ABNORMAL HIGH (ref 70–99)
Potassium: 3.5 mmol/L (ref 3.5–5.1)
Sodium: 141 mmol/L (ref 135–145)

## 2022-05-23 LAB — CBC WITH DIFFERENTIAL/PLATELET
Abs Immature Granulocytes: 0.02 10*3/uL (ref 0.00–0.07)
Basophils Absolute: 0.1 10*3/uL (ref 0.0–0.1)
Basophils Relative: 1 %
Eosinophils Absolute: 0 10*3/uL (ref 0.0–0.5)
Eosinophils Relative: 0 %
HCT: 45.5 % (ref 39.0–52.0)
Hemoglobin: 15.8 g/dL (ref 13.0–17.0)
Immature Granulocytes: 0 %
Lymphocytes Relative: 14 %
Lymphs Abs: 1.2 10*3/uL (ref 0.7–4.0)
MCH: 31.5 pg (ref 26.0–34.0)
MCHC: 34.7 g/dL (ref 30.0–36.0)
MCV: 90.8 fL (ref 80.0–100.0)
Monocytes Absolute: 0.4 10*3/uL (ref 0.1–1.0)
Monocytes Relative: 5 %
Neutro Abs: 6.5 10*3/uL (ref 1.7–7.7)
Neutrophils Relative %: 80 %
Platelets: 293 10*3/uL (ref 150–400)
RBC: 5.01 MIL/uL (ref 4.22–5.81)
RDW: 12.2 % (ref 11.5–15.5)
WBC: 8.2 10*3/uL (ref 4.0–10.5)
nRBC: 0.2 % (ref 0.0–0.2)

## 2022-05-23 MED ORDER — MELOXICAM 15 MG PO TABS: 15.0000 mg | ORAL_TABLET | Freq: Every day | ORAL | 0 refills | Status: DC

## 2022-05-23 MED ORDER — CYCLOBENZAPRINE HCL 10 MG PO TABS
10.0000 mg | ORAL_TABLET | Freq: Three times a day (TID) | ORAL | 0 refills | Status: AC | PRN
Start: 2022-05-23 — End: 2022-05-28

## 2022-05-23 MED ORDER — CYCLOBENZAPRINE HCL 5 MG PO TABS: 5.0000 mg | ORAL_TABLET | Freq: Three times a day (TID) | ORAL | 1 refills | Status: DC

## 2022-05-23 NOTE — ED Triage Notes (Signed)
Pt states that he has had lower back pain for a couple months but then a couple weeks ago noticed a lump in his lower back- pt has seen his primary for it and has an [place] scheduled, but states the pain is getting worse- not obvious cyst, redness, or swelling noted to area

## 2022-05-23 NOTE — ED Notes (Signed)
See triage note  Presents with pain to lower back States he has felt a "lump" to area  is scheduled for outpt U/S by his PCP  states pain is worse and he feels like area is larger

## 2022-05-23 NOTE — Discharge Instructions (Addendum)
-  Reviewed the educational material provided.  Reduce the amount of heavy lifting that you do on a daily basis as much as you can.  -You may take meloxicam as needed for pain.  Do not take ibuprofen or naproxen while you are taking this medication, as it is of a similar class.  -May take cyclobenzaprine as well for muscle relaxation, though use caution as it may make you dizzy/drowsy.  -Please follow-up with the orthopedist listed in these instructions if your symptoms fail to improve after 1 to 2 weeks.  -Return to the emergency department anytime if you begin to experience any new or worsening symptoms.

## 2022-05-23 NOTE — ED Provider Notes (Signed)
[facility] Provider Note    Event Date/Time   First MD Initiated Contact with Patient 05/23/22 1047     (approximate)   History   Chief Complaint Back Pain   HPI Victor Fowler is a 26 y.o. male, history of GERD, chronic back pain, presents emergency department for evaluation of back pain x2 months.  He states that he works in a [employer] job and frequently has to do a lot of lifting.  He has had pain in his lower back, as well as his neck for the past couple months.  He started growing concern when he noticed a lump adjacent to his lumbar spine on the left side.  He saw his primary care provider, who had him scheduled for a ultrasound, however this is not for another week.  Endorses some numbness/tingling in his left lower extremity as well.  Denies any IV drug use.  Denies fever/chills, chest pain, shortness of breath, abdominal pain, flank pain, nausea/vomiting, diarrhea, bowel/bladder dysfunction, saddle anesthesia, numbness or tingling upper extremities, or dizziness/lightheadedness.  History Limitations: No limitations.        Physical Exam  Triage Vital Signs: ED Triage Vitals  Enc Vitals Group     BP 05/23/22 1036 (!) 163/96     Pulse Rate 05/23/22 1036 82     Resp 05/23/22 1036 16     Temp 05/23/22 1036 98.4 F (36.9 C)     Temp Source 05/23/22 1036 Oral     SpO2 05/23/22 1036 98 %     Weight 05/23/22 1058 180 lb 1.9 oz (81.7 kg)     Height 05/23/22 1058 5\' 6"  (1.676 m)     Head Circumference --      Peak Flow --      Pain Score 05/23/22 1041 10     Pain Loc --      Pain Edu? --      Excl. in [place]? --     Most recent vital signs: Vitals:   05/23/22 1036  BP: (!) 163/96  Pulse: 82  Resp: 16  Temp: 98.4 F (36.9 C)  SpO2: 98%    General: Awake, NAD.  Skin: Warm, dry. No rashes or lesions.  Eyes: PERRL. Conjunctivae normal.  CV: Good peripheral perfusion.  Resp: Normal effort.  Abd: Soft, non-tender. No distention.  Neuro:  At baseline. No gross neurological deficits.  Musculoskeletal: Normal ROM of all extremities.   Focused Exam: Normal range of motion of the head/neck.  No midline cervical spine tenderness.  Palpable "knot" approximately 2-3 cm left of the lumbar spine, along L2/L3.  No midline spinal tenderness.  Positive straight leg test on the left lower extremity.  PMS intact distally.  Physical Exam    ED Results / Procedures / Treatments  Labs (all labs ordered are listed, but only abnormal results are displayed) Labs Reviewed  BASIC METABOLIC PANEL - Abnormal; Notable for the following components:      Result Value   Glucose, Bld 129 (*)    All other components within normal limits  CBC WITH DIFFERENTIAL/PLATELET     EKG N/A.   RADIOLOGY  ED Provider Interpretation: I personally reviewed and interpreted these images.  Ultrasound does not show any acute findings.  CT lumbar negative.  CT cervical spine negative.  US PELVIS LIMITED (TRANSABDOMINAL ONLY)  Result Date: 05/23/2022 CLINICAL DATA:  Left lower back palpable abnormality. EXAM: LIMITED ULTRASOUND OF PELVIS TECHNIQUE: Limited transabdominal ultrasound examination of the pelvis  was performed. COMPARISON:  CT scan of same day. FINDINGS: Limited sonographic evaluation was performed in the area of palpable concern in the left lower back. No definite sonographic evidence of mass, cyst or fluid collection is noted. IMPRESSION: No definite sonographic abnormality seen in the area of palpable concern in the left lower back. Electronically Signed   By: Lupita Raider M.D.   On: 05/23/2022 12:18   CT Lumbar Spine Wo Contrast  Result Date: 05/23/2022 CLINICAL DATA:  Lumbar radiculopathy, symptoms persist with > 6 wks treatment EXAM: CT LUMBAR SPINE WITHOUT CONTRAST TECHNIQUE: Multidetector CT imaging of the lumbar spine was performed without intravenous contrast administration. Multiplanar CT image reconstructions were also generated.  RADIATION DOSE REDUCTION: This exam was performed according to the departmental dose-optimization program which includes automated exposure control, adjustment of the mA and/or kV according to patient size and/or use of iterative reconstruction technique. COMPARISON:  None Available. FINDINGS: Segmentation: 5 lumbar type vertebrae. Alignment: Normal. Vertebrae: No acute fracture or focal pathologic process. Paraspinal and other soft tissues: Negative. No CT correlate is noted for the soft tissue abnormality reported in the lower back. Disc levels: No significant degenerative disc or facet disease in the visualized lumbar spine. No evidence of high-grade spinal canal or neural foraminal stenosis. IMPRESSION: No CT correlate is seen for the patient reported soft tissue abnormality in the lower back. See results from same day soft tissue ultrasound. Electronically Signed   By: Lorenza Cambridge M.D.   On: 05/23/2022 12:07   CT Cervical Spine Wo Contrast  Result Date: 05/23/2022 CLINICAL DATA:  Acute neck pain EXAM: CT CERVICAL SPINE WITHOUT CONTRAST TECHNIQUE: Multidetector CT imaging of the cervical spine was performed without intravenous contrast. Multiplanar CT image reconstructions were also generated. RADIATION DOSE REDUCTION: This exam was performed according to the departmental dose-optimization program which includes automated exposure control, adjustment of the mA and/or kV according to patient size and/or use of iterative reconstruction technique. COMPARISON:  MRI cervical spine 12/06/2020 FINDINGS: Alignment: Normal Skull base and vertebrae: Osseous mineralization normal. Skull base intact. Vertebral body and disc space heights maintained. No fracture, subluxation, or bone destruction. Soft tissues and spinal canal: Prevertebral soft tissues normal thickness. No cervical soft tissue abnormalities. Disc levels:  Unremarkable Upper chest: Lung apices clear Other: N/A IMPRESSION: Normal exam. Electronically  Signed   By: Ulyses Southward M.D.   On: 05/23/2022 12:02    PROCEDURES:  Critical Care performed: N/A.  Procedures    MEDICATIONS ORDERED IN ED: Medications - No data to display   IMPRESSION / MDM / ASSESSMENT AND PLAN / ED COURSE  I reviewed the triage vital signs and the nursing notes.                              Differential diagnosis includes, but is not limited to, lumbosacral strain, abscess, sciatica, lumbar radiculopathy, disc herniation  Assessment/Plan Patient presents with ongoing left-sided back pain and neck pain x2 months with a palpable knot adjacent to the lumbar spine.  CT imaging is unremarkable.  Ultrasound does not show any evidence of abscess or other acute findings.  Very low suspicion for any serious or life-threatening pathology, such as cauda equina or spinal epidural abscess.  Patient does have some active type pain on the left lower extremity, but is otherwise neurovascularly intact.  We will provide him with a prescription for cyclobenzaprine and meloxicam.  Additionally provided with a referral to  orthopedics if his symptoms fail to improve after 1 to 2 weeks.  With discharge.  Provided the patient with anticipatory guidance, return precautions, and educational material. Encouraged the patient to return to the emergency department at any time if they begin to experience any new or worsening symptoms. Patient expressed understanding and agreed with the plan.   Patient's presentation is most consistent with acute complicated illness / injury requiring diagnostic workup.       FINAL CLINICAL IMPRESSION(S) / ED DIAGNOSES   Final diagnoses:  Strain of lumbar region, initial encounter  Sciatica of left side     Rx / DC Orders   ED Discharge Orders          Ordered    meloxicam (MOBIC) 15 MG tablet  Daily        05/23/22 1320    cyclobenzaprine (FLEXERIL) 10 MG tablet  3 times daily PRN        05/23/22 1320    cyclobenzaprine (FLEXERIL) 5 MG tablet   3 times daily,   Status:  Discontinued        05/23/22 1320             Note:  This document was prepared using Dragon voice recognition software and may include unintentional dictation errors.   Varney Daily, [place] 05/23/22 1337    Shaune Pollack, MD 05/28/22 [phone]

## 2022-05-29 ENCOUNTER — Ambulatory Visit
Admission: RE | Admit: 2022-05-29 | Discharge: 2022-05-29 | Disposition: A | Discharge status: Home / Self Care | Payer: Self-pay | Source: Ambulatory Visit | Attending: Nurse Practitioner | Admitting: Nurse Practitioner

## 2022-05-29 DIAGNOSIS — M545 Low back pain, unspecified: Secondary | ICD-10-CM

## 2022-06-14 DIAGNOSIS — E162 Hypoglycemia, unspecified: Secondary | ICD-10-CM | POA: Insufficient documentation

## 2022-06-14 DIAGNOSIS — R7301 Impaired fasting glucose: Secondary | ICD-10-CM | POA: Insufficient documentation

## 2022-06-14 NOTE — Patient Instructions (Signed)

## 2022-06-17 ENCOUNTER — Encounter: Payer: Self-pay | Admitting: Nurse Practitioner

## 2022-06-17 ENCOUNTER — Ambulatory Visit (INDEPENDENT_AMBULATORY_CARE_PROVIDER_SITE_OTHER): Payer: Commercial Managed Care - PPO | Admitting: Nurse Practitioner

## 2022-06-17 VITALS — BP 117/79 | HR 76 | Temp 98.7°F | Ht 66.0 in | Wt 177.6 lb

## 2022-06-17 DIAGNOSIS — M545 Low back pain, unspecified: Secondary | ICD-10-CM

## 2022-06-17 DIAGNOSIS — Z23 Encounter for immunization: Secondary | ICD-10-CM | POA: Diagnosis not present

## 2022-06-17 DIAGNOSIS — Z114 Encounter for screening for human immunodeficiency virus [HIV]: Secondary | ICD-10-CM | POA: Diagnosis not present

## 2022-06-17 DIAGNOSIS — R7301 Impaired fasting glucose: Secondary | ICD-10-CM

## 2022-06-17 DIAGNOSIS — E559 Vitamin D deficiency, unspecified: Secondary | ICD-10-CM | POA: Diagnosis not present

## 2022-06-17 DIAGNOSIS — E162 Hypoglycemia, unspecified: Secondary | ICD-10-CM

## 2022-06-17 MED ORDER — CYCLOBENZAPRINE HCL 10 MG PO TABS: 10.0000 mg | ORAL_TABLET | Freq: Three times a day (TID) | ORAL | 0 refills | Status: DC | PRN

## 2022-06-17 NOTE — Assessment & Plan Note (Signed)
Ongoing and followed by endo -- continue this collaboration and attempt to obtain notes.  Labs today.

## 2022-06-17 NOTE — Progress Notes (Signed)
BP 117/79   Pulse 76   Temp 98.7 F (37.1 C) (Oral)   Ht 5\' 6"  (1.676 m)   Wt 177 lb 9.6 oz (80.6 kg)   SpO2 96%   BMI 28.67 kg/m    Subjective:    Patient ID: Victor Fowler, male    DOB: April 06, 1996, 26 y.o.   MRN: [id number]  HPI: Victor Fowler is a 26 y.o. male  Chief Complaint  Patient presents with   Back Pain   Mass    Patient says the lump that was discussed at his last office visit is staying about the same size and has become a little painful. Patient says the discomfort and ache is pretty constant.    LUMP OF SKIN Area to left lower back that is uncomfortable.  Has had back pain in this area for several months.  Did have imaging in ER on 05/23/22 to lumbar and cervical spine + ultrasound -- all with no abnormal findings.  He reports continuing to feel area to left lower back + continuing to have pain to area.  Constant back pain to area, dull and aching -- sitting for long period and standing up causes more pain.    Sees Dr. Ronnald Collum for history of low blood sugars after eating, saw last about one month ago.  Currently he is off BP medications with stable BP.   Duration: days Location: left lower back History of trauma in area: no Pain: yes Quality: yes Severity: 4-5/10 Redness: no Swelling: yes Oozing: no Pus: no Fevers: no Nausea/vomiting: no Status: fluctuating Treatments attempted:warm compresses , Meloxicam Tetanus: Not UTD   Relevant past medical, surgical, family and social history reviewed and updated as indicated. Interim medical history since our last visit reviewed. Allergies and medications reviewed and updated.  Review of Systems  Constitutional:  Negative for activity change, diaphoresis, fatigue and fever.  Respiratory:  Negative for cough, chest tightness, shortness of breath and wheezing.   Cardiovascular:  Negative for chest pain, palpitations and leg swelling.  Musculoskeletal:  Positive for arthralgias.  Neurological: Negative.    Psychiatric/Behavioral: Negative.      Per HPI unless specifically indicated above     Objective:    BP 117/79   Pulse 76   Temp 98.7 F (37.1 C) (Oral)   Ht 5\' 6"  (1.676 m)   Wt 177 lb 9.6 oz (80.6 kg)   SpO2 96%   BMI 28.67 kg/m   Wt Readings from Last 3 Encounters:  06/17/22 177 lb 9.6 oz (80.6 kg)  05/23/22 180 lb 1.9 oz (81.7 kg)  05/20/22 180 lb 4.8 oz (81.8 kg)    Physical Exam Vitals and nursing note reviewed.  Constitutional:      General: He is awake. He is not in acute distress.    Appearance: He is well-developed. He is not ill-appearing or toxic-appearing.  HENT:     Head: Normocephalic and atraumatic.     Right Ear: Hearing normal. No drainage.     Left Ear: Hearing normal. No drainage.  Eyes:     General: Lids are normal.        Right eye: No discharge.        Left eye: No discharge.     Conjunctiva/sclera: Conjunctivae normal.     Pupils: Pupils are equal, round, and reactive to light.  Cardiovascular:     Rate and Rhythm: Normal rate and regular rhythm.     Heart sounds: Normal heart sounds, S1 normal  and S2 normal. No murmur heard.    No gallop.  Pulmonary:     Effort: Pulmonary effort is normal. No accessory muscle usage or respiratory distress.     Breath sounds: Normal breath sounds.  Abdominal:     General: Bowel sounds are normal.     Palpations: Abdomen is soft.  Musculoskeletal:        General: Normal range of motion.     Cervical back: Normal range of motion and neck supple.     Right lower leg: No edema.     Left lower leg: No edema.  Skin:    General: Skin is warm and dry.     Capillary Refill: Capillary refill takes less than 2 seconds.       Neurological:     Mental Status: He is alert and oriented to person, place, and time.     Deep Tendon Reflexes: Reflexes are normal and symmetric.     Reflex Scores:      Brachioradialis reflexes are 2+ on the right side and 2+ on the left side.      Patellar reflexes are 2+ on the  right side and 2+ on the left side. Psychiatric:        Attention and Perception: Attention normal.        Mood and Affect: Mood normal.        Speech: Speech normal.        Behavior: Behavior normal. Behavior is cooperative.        Thought Content: Thought content normal.    Results for orders placed or performed during the hospital encounter of 05/23/22  CBC with Differential  Result Value Ref Range   WBC 8.2 4.0 - 10.5 K/uL   RBC 5.01 4.22 - 5.81 MIL/uL   Hemoglobin 15.8 13.0 - 17.0 g/dL   HCT 33.0 07.6 - 22.6 %   MCV 90.8 80.0 - 100.0 fL   MCH 31.5 26.0 - 34.0 pg   MCHC 34.7 30.0 - 36.0 g/dL   RDW 33.3 54.5 - 62.5 %   Platelets 293 150 - 400 K/uL   nRBC 0.2 0.0 - 0.2 %   Neutrophils Relative % 80 %   Neutro Abs 6.5 1.7 - 7.7 K/uL   Lymphocytes Relative 14 %   Lymphs Abs 1.2 0.7 - 4.0 K/uL   Monocytes Relative 5 %   Monocytes Absolute 0.4 0.1 - 1.0 K/uL   Eosinophils Relative 0 %   Eosinophils Absolute 0.0 0.0 - 0.5 K/uL   Basophils Relative 1 %   Basophils Absolute 0.1 0.0 - 0.1 K/uL   Immature Granulocytes 0 %   Abs Immature Granulocytes 0.02 0.00 - 0.07 K/uL  Basic metabolic panel  Result Value Ref Range   Sodium 141 135 - 145 mmol/L   Potassium 3.5 3.5 - 5.1 mmol/L   Chloride 108 98 - 111 mmol/L   CO2 26 22 - 32 mmol/L   Glucose, Bld 129 (H) 70 - 99 mg/dL   BUN 17 6 - 20 mg/dL   Creatinine, Ser 6.38 0.61 - 1.24 mg/dL   Calcium 9.5 8.9 - 93.7 mg/dL   GFR, Estimated >34 >28 mL/min   Anion gap 7 5 - 15      Assessment & Plan:   Problem List Items Addressed This Visit       Endocrine   Hypoglycemia    Ongoing and followed by endo -- continue this collaboration and attempt to obtain notes.  Labs today.  Relevant Orders   Basic metabolic panel   TSH     Other   Acute left-sided low back pain without sciatica - Primary    For months with small, firm, cyst like mass noted on palpation that is tender to touch, recent imaging all normal.  At this time  recommend Voltaren gel to skin TID and Tylenol as needed + will send in Flexeril to take as needed (educated him not to take while working or driving).  Referral to ortho placed due to ongoing discomfort.      Relevant Medications   cyclobenzaprine (FLEXERIL) 10 MG tablet   Other Relevant Orders   Ambulatory referral to Orthopedics   Other Visit Diagnoses     Vitamin D deficiency       History of low levels reported, check today and initiate supplement as needed.   Relevant Orders   VITAMIN D 25 Hydroxy (Vit-D Deficiency, Fractures)   Encounter for screening for HIV       HIV screen on labs today per guidelines for one time screening, discussed with patient.   Relevant Orders   HIV Antibody (routine testing w rflx)   Need for Td vaccine       Td in office today.   Relevant Orders   Td vaccine greater than or equal to 7yo preservative free IM        Follow up plan: Return in about 6 weeks (around 07/29/2022) for Back Pain.

## 2022-06-17 NOTE — Assessment & Plan Note (Addendum)
For months with small, firm, cyst like mass noted on palpation that is tender to touch, recent imaging all normal.  At this time recommend Voltaren gel to skin TID and Tylenol as needed + will send in Flexeril to take as needed (educated him not to take while working or driving).  Referral to ortho placed due to ongoing discomfort.

## 2022-06-18 LAB — BASIC METABOLIC PANEL
BUN/Creatinine Ratio: 19 (ref 9–20)
BUN: 19 mg/dL (ref 6–20)
CO2: 24 mmol/L (ref 20–29)
Calcium: 9.9 mg/dL (ref 8.7–10.2)
Chloride: 102 mmol/L (ref 96–106)
Creatinine, Ser: 1.01 mg/dL (ref 0.76–1.27)
Glucose: 95 mg/dL (ref 70–99)
Potassium: 4.7 mmol/L (ref 3.5–5.2)
Sodium: 139 mmol/L (ref 134–144)
eGFR: 105 mL/min/{1.73_m2} (ref >59)

## 2022-06-18 LAB — HIV ANTIBODY (ROUTINE TESTING W REFLEX): HIV Screen 4th Generation wRfx: NONREACTIVE

## 2022-06-18 LAB — TSH: TSH: 2.33 u[IU]/mL (ref 0.450–4.500)

## 2022-06-18 LAB — VITAMIN D 25 HYDROXY (VIT D DEFICIENCY, FRACTURES): Vit D, 25-Hydroxy: 21.2 ng/mL — ABNORMAL LOW (ref 30.0–100.0)

## 2022-06-18 NOTE — Progress Notes (Signed)
Contacted via [employer] [facility] [employer], your labs have returned.  Vitamin D level is a little low, I do recommend you start taking Vitamin D3 2000 units daily (over the counter) for bone health.  Remainder of labs are all stable.  Any questions? Keep being awesome!!  Thank you for allowing me to participate in your care.  I appreciate you. Kindest regards, Nonna Renninger

## 2022-07-29 ENCOUNTER — Ambulatory Visit: Payer: Commercial Managed Care - PPO | Admitting: Nurse Practitioner

## 2022-08-03 DIAGNOSIS — E559 Vitamin D deficiency, unspecified: Secondary | ICD-10-CM | POA: Insufficient documentation

## 2022-08-03 NOTE — Patient Instructions (Signed)
Managing Chronic Back Pain Chronic back pain is back pain that lasts for 12 weeks or longer. It often affects the lower back. Back pain may feel like a muscle ache or a sharp, stabbing pain. It can be mild, moderate, or severe. If you have been diagnosed with chronic back pain, there are things you can do to manage your symptoms. You may have to try different things to see what works best for you. Your health care provider may also give you specific instructions. How to manage lifestyle changes Treating chronic back pain often starts with rest and pain relief, followed by exercises to restore movement and strength to your back (physical therapy). You may need surgery if other treatments do not help, or if your pain is caused by a condition or an injury. Follow your treatment plan as told by your health care provider. This may include: Relaxation techniques. Talk therapy or counseling with a mental health specialist. A form of talk therapy called cognitive behavioral therapy (CBT) can be especially helpful. This therapy helps you set goals and follow up on the changes that you make. Acupuncture or massage therapy. Local electrical stimulation. Injections. These deliver numbing or pain-relieving medicines into your spine or the area of pain. How to recognize changes in your chronic back pain Your condition may improve with treatment. However, back pain may not go away or may get worse over time. Watch your symptoms carefully and let your health care provider know if your symptoms get worse or do not improve. Your back pain may be getting worse if you have: Pain that begins to cause problems with posture. Pain that gets worse when you are sitting, standing, walking, bending, or lifting. Pain that affects you while you are active, or at rest, or both. Pain that eventually makes it hard to move around (limits mobility). Pain that occurs with fever, weight loss, or difficulty urinating. Pain that causes  numbness and tingling. How to use body mechanics and posture to help with pain Healthy body mechanics and good posture can help to relieve stress on your back. Body mechanics refers to the movements and positions of your body during your daily activities. Posture is part of body mechanics. Good posture means: Your spine is in its natural S-curve, or neutral, position. Your shoulders are pulled back slightly. Your head is not tipped forward. Follow these guidelines to improve your posture and body mechanics in your everyday activities. Standing  When standing, keep your spine neutral and your feet about hip-width apart. Keep your knees slightly bent. Your ears, shoulders, and hips should line up. When you do a task in which you stand in one place for a long time, place one foot on a stable object that is 2-4 inches (5-10 cm) high, such as a footstool. This helps keep your spine neutral. Sitting  When sitting, keep your spine neutral and your feet flat on the floor. Use a footrest, if necessary, and keep your thighs parallel to the floor. Avoid rounding your shoulders, and avoid tilting your head forward. When working at a desk or a computer, keep your desk at a height where your hands are slightly lower than your elbows. Slide your chair under your desk so you are close enough to maintain good posture. When working at a computer, place your monitor at a height where you are looking straight ahead and you do not have to tilt your head forward or downward to view the screen. Lifting  Keep your feet at   least shoulder-width apart and tighten the muscles of your abdomen. Bend your knees and hips and keep your spine neutral. Be sure to lift using the strength of your legs, not your back. Do not lock your knees straight out. Always ask for help to lift heavy or awkward objects. Resting  When lying down and resting, avoid positions that are most painful. If you have pain with activities such as  sitting, bending, stooping, or squatting, lie in a position in which your body does not bend very much. For example, avoid curling up on your side with your arms and knees near your chest (fetal position). If you have pain with activities such as standing for a long time or reaching with your arms, lie with your spine in a neutral position and bend your knees slightly. Try: Lying on your side with a pillow between your knees. Lying on your back with a pillow under your knees. Follow these instructions at home: Medicines Treatment may include over-the-counter or prescription medicines for pain and inflammation that are taken by mouth or applied to the skin. Another treatment may include muscle relaxants. Take over-the-counter and prescription medicines only as told by your health care provider. Ask your health care provider if the medicine prescribed to you: Requires you to avoid driving or using machinery. Can cause constipation. You may need to take these actions to prevent or treat constipation: Drink enough fluid to keep your urine pale yellow. Take over-the-counter or prescription medicines. Eat foods that are high in fiber, such as beans, whole grains, and fresh fruits and vegetables. Limit foods that are high in fat and processed sugars, such as fried or sweet foods. Lifestyle Do not use any products that contain nicotine or tobacco, such as cigarettes, e-cigarettes, and chewing tobacco. If you need help quitting, ask your health care provider. Eat a healthy diet that includes foods such as vegetables, fruits, fish, and lean meats. Work with your health care provider to achieve or maintain a healthy weight. General instructions Get regular exercise as told. Exercise improves flexibility and strength. If physical therapy was prescribed, do exercises as told by your health care provider. Use ice or heat therapy as told by your health care provider. Keep all follow-up visits as told by your  health care provider. This is important. Where can I get support? Consider joining a support group for people managing chronic back pain. Ask your health care provider about support groups in your area. You can also find online and in-person support groups through: The American Chronic Pain Association: theacpa.org Pain Connection Program: painconnection.org Contact a health care provider if: You have pain that is not relieved with rest or medicine. Your pain gets worse, or you have new pain. You have a fever. You have rapid weight loss. You have trouble doing your normal activities. Get help right away if: You have weakness or numbness in one or both of your legs or feet. You have trouble controlling your bladder or your bowels. You have severe back pain and have any of the following: Nausea or vomiting. Abdominal pain. Shortness of breath or you faint. Summary Chronic back pain is often treated with rest, pain relief, and physical therapy. Talk therapy, acupuncture, massage, and local electrical stimulation may help. Follow your treatment plan as told by your health care provider. Joining a support group may help you manage chronic back pain. This information is not intended to replace advice given to you by your health care provider. Make sure you   discuss any questions you have with your health care provider. Document Revised: 10/07/2019 Document Reviewed: 06/15/2019 Elsevier Patient Education  2023 Elsevier Inc.  

## 2022-08-05 ENCOUNTER — Encounter: Payer: Self-pay | Admitting: Nurse Practitioner

## 2022-08-05 ENCOUNTER — Ambulatory Visit (INDEPENDENT_AMBULATORY_CARE_PROVIDER_SITE_OTHER): Payer: Commercial Managed Care - PPO | Admitting: Nurse Practitioner

## 2022-08-05 VITALS — BP 106/73 | HR 90 | Temp 98.2°F | Ht 65.98 in | Wt 177.2 lb

## 2022-08-05 DIAGNOSIS — E559 Vitamin D deficiency, unspecified: Secondary | ICD-10-CM

## 2022-08-05 DIAGNOSIS — M545 Low back pain, unspecified: Secondary | ICD-10-CM

## 2022-08-05 NOTE — Assessment & Plan Note (Signed)
For months with small, firm, cyst like mass noted on palpation that is tender to touch, recent imaging all reassuring with no acute findings.  At this time recommend Voltaren gel to skin TID and Tylenol as needed + Flexeril to take as needed (educated him not to take while working or driving).  New referral to ortho placed due to ongoing discomfort and did not get scheduled with first referral.

## 2022-08-05 NOTE — Progress Notes (Signed)
BP 106/73   Pulse 90   Temp 98.2 F (36.8 C) (Oral)   Ht 5' 5.98" (1.676 m)   Wt 177 lb 3.2 oz (80.4 kg)   SpO2 98%   BMI 28.61 kg/m    Subjective:    Patient ID: Victor Fowler, male    DOB: 1996-08-06, 26 y.o.   MRN: [id number]  HPI: Victor Fowler is a 26 y.o. male  Chief Complaint  Patient presents with   Back Pain    Patient states pain is still the same    BACK PAIN Follow-up for back pain.  Has had back pain in this area for several months.  Did have imaging in ER on 05/23/22 to lumbar and cervical spine + ultrasound -- all with no abnormal findings.  He reports continuing to feel area to left lower back + continuing to have pain to area.  Constant back pain to area, dull and aching -- sitting for long period and standing up causes more pain.  Referral to ortho placed on 06/17/22 --- has not been seen by them as of yet.  Ordered Flexeril last visit, but has not tried this.   Duration: days Location: left lower back History of trauma in area: no Pain: yes Quality: yes Severity: 4/10 -- constant Redness: no Swelling: yes Oozing: no Pus: no Fevers: no Nausea/vomiting: no Status: remains the same Treatments attempted: heat and Meloxicam Tetanus: Not UTD   Relevant past medical, surgical, family and social history reviewed and updated as indicated. Interim medical history since our last visit reviewed. Allergies and medications reviewed and updated.  Review of Systems  Constitutional:  Negative for activity change, diaphoresis, fatigue and fever.  Respiratory:  Negative for cough, chest tightness, shortness of breath and wheezing.   Cardiovascular:  Negative for chest pain, palpitations and leg swelling.  Musculoskeletal:  Positive for arthralgias.  Neurological: Negative.   Psychiatric/Behavioral: Negative.     Per HPI unless specifically indicated above     Objective:    BP 106/73   Pulse 90   Temp 98.2 F (36.8 C) (Oral)   Ht 5' 5.98" (1.676 m)    Wt 177 lb 3.2 oz (80.4 kg)   SpO2 98%   BMI 28.61 kg/m   Wt Readings from Last 3 Encounters:  08/05/22 177 lb 3.2 oz (80.4 kg)  06/17/22 177 lb 9.6 oz (80.6 kg)  05/23/22 180 lb 1.9 oz (81.7 kg)    Physical Exam Vitals and nursing note reviewed.  Constitutional:      General: He is awake. He is not in acute distress.    Appearance: He is well-developed. He is not ill-appearing or toxic-appearing.  HENT:     Head: Normocephalic and atraumatic.     Right Ear: Hearing normal. No drainage.     Left Ear: Hearing normal. No drainage.  Eyes:     General: Lids are normal.        Right eye: No discharge.        Left eye: No discharge.     Conjunctiva/sclera: Conjunctivae normal.     Pupils: Pupils are equal, round, and reactive to light.  Cardiovascular:     Rate and Rhythm: Normal rate and regular rhythm.     Heart sounds: Normal heart sounds, S1 normal and S2 normal. No murmur heard.    No gallop.  Pulmonary:     Effort: Pulmonary effort is normal. No accessory muscle usage or respiratory distress.     Breath  sounds: Normal breath sounds.  Abdominal:     General: Bowel sounds are normal.     Palpations: Abdomen is soft.  Musculoskeletal:        General: Normal range of motion.     Cervical back: Normal range of motion and neck supple.     Right lower leg: No edema.     Left lower leg: No edema.  Skin:    General: Skin is warm and dry.     Capillary Refill: Capillary refill takes less than 2 seconds.       Neurological:     Mental Status: He is alert and oriented to person, place, and time.     Deep Tendon Reflexes: Reflexes are normal and symmetric.     Reflex Scores:      Brachioradialis reflexes are 2+ on the right side and 2+ on the left side.      Patellar reflexes are 2+ on the right side and 2+ on the left side. Psychiatric:        Attention and Perception: Attention normal.        Mood and Affect: Mood normal.        Speech: Speech normal.        Behavior:  Behavior normal. Behavior is cooperative.        Thought Content: Thought content normal.    Results for orders placed or performed in visit on 16/10/96  Basic metabolic panel  Result Value Ref Range   Glucose 95 70 - 99 mg/dL   BUN 19 6 - 20 mg/dL   Creatinine, Ser 1.01 0.76 - 1.27 mg/dL   eGFR 105 >59 mL/min/1.73   BUN/Creatinine Ratio 19 9 - 20   Sodium 139 134 - 144 mmol/L   Potassium 4.7 3.5 - 5.2 mmol/L   Chloride 102 96 - 106 mmol/L   CO2 24 20 - 29 mmol/L   Calcium 9.9 8.7 - 10.2 mg/dL  HIV Antibody (routine testing w rflx)  Result Value Ref Range   HIV Screen 4th Generation wRfx Non Reactive Non Reactive  TSH  Result Value Ref Range   TSH 2.330 0.450 - 4.500 uIU/mL  VITAMIN D 25 Hydroxy (Vit-D Deficiency, Fractures)  Result Value Ref Range   Vit D, 25-Hydroxy 21.2 (L) 30.0 - 100.0 ng/mL      Assessment & Plan:   Problem List Items Addressed This Visit       Other   Acute left-sided low back pain without sciatica - Primary    For months with small, firm, cyst like mass noted on palpation that is tender to touch, recent imaging all reassuring with no acute findings.  At this time recommend Voltaren gel to skin TID and Tylenol as needed + Flexeril to take as needed (educated him not to take while working or driving).  New referral to ortho placed due to ongoing discomfort and did not get scheduled with first referral.      Relevant Orders   Ambulatory referral to Orthopedics     Follow up plan: Return in about 6 months (around 02/03/2023) for BACK PAIN.

## 2023-02-04 ENCOUNTER — Ambulatory Visit: Payer: Self-pay | Admitting: Nurse Practitioner

## 2023-04-24 ENCOUNTER — Emergency Department
Admission: EM | Admit: 2023-04-24 | Discharge: 2023-04-24 | Discharge status: Against Medical Advice | Payer: Self-pay | Attending: Emergency Medicine | Admitting: Emergency Medicine

## 2023-04-24 ENCOUNTER — Emergency Department: Payer: Self-pay

## 2023-04-24 ENCOUNTER — Other Ambulatory Visit: Payer: Self-pay

## 2023-04-24 DIAGNOSIS — Z5321 Procedure and treatment not carried out due to patient leaving prior to being seen by health care provider: Secondary | ICD-10-CM | POA: Insufficient documentation

## 2023-04-24 DIAGNOSIS — R110 Nausea: Secondary | ICD-10-CM | POA: Insufficient documentation

## 2023-04-24 DIAGNOSIS — R079 Chest pain, unspecified: Secondary | ICD-10-CM | POA: Insufficient documentation

## 2023-04-24 DIAGNOSIS — R0602 Shortness of breath: Secondary | ICD-10-CM | POA: Insufficient documentation

## 2023-04-24 LAB — BASIC METABOLIC PANEL
Anion gap: 11 (ref 5–15)
BUN: 17 mg/dL (ref 6–20)
CO2: 23 mmol/L (ref 22–32)
Calcium: 9.3 mg/dL (ref 8.9–10.3)
Chloride: 104 mmol/L (ref 98–111)
Creatinine, Ser: 0.98 mg/dL (ref 0.61–1.24)
GFR, Estimated: >60 mL/min (ref >60)
Glucose, Bld: 131 mg/dL — ABNORMAL HIGH (ref 70–99)
Potassium: 3.7 mmol/L (ref 3.5–5.1)
Sodium: 138 mmol/L (ref 135–145)

## 2023-04-24 LAB — CBC
HCT: 44.2 % (ref 39.0–52.0)
Hemoglobin: 15.6 g/dL (ref 13.0–17.0)
MCH: 31.9 pg (ref 26.0–34.0)
MCHC: 35.3 g/dL (ref 30.0–36.0)
MCV: 90.4 fL (ref 80.0–100.0)
Platelets: 281 10*3/uL (ref 150–400)
RBC: 4.89 MIL/uL (ref 4.22–5.81)
RDW: 12.2 % (ref 11.5–15.5)
WBC: 8.9 10*3/uL (ref 4.0–10.5)
nRBC: 0 % (ref 0.0–0.2)

## 2023-04-24 LAB — TROPONIN I (HIGH SENSITIVITY)
Troponin I (High Sensitivity): 3 ng/L (ref <18)
Troponin I (High Sensitivity): 3 ng/L (ref <18)

## 2023-04-24 NOTE — ED Triage Notes (Signed)
Pt to Ed via POV from home. Pt reports intermittent left sided CP w/ radiation to left arm and SOB that started after he left work. Pt endorses nausea.

## 2023-04-25 ENCOUNTER — Telehealth: Payer: Self-pay

## 2023-04-25 NOTE — Transitions of Care (Post Inpatient/ED Visit) (Unsigned)
   04/25/2023  Name: Victor Fowler MRN: [id number] DOB: August 17, 1996  Today's TOC FU Call Status: Today's TOC FU Call Status:: Unsuccessful Call (1st Attempt) Unsuccessful Call (1st Attempt) Date: 04/25/23  Attempted to reach the patient regarding the most recent Inpatient/ED visit.  Follow Up Plan: Additional outreach attempts will be made to reach the patient to complete the Transitions of Care (Post Inpatient/ED visit) call.   Signature: Wilhemena Durie, CMA

## 2023-04-28 NOTE — Transitions of Care (Post Inpatient/ED Visit) (Unsigned)
   04/28/2023  Name: Victor Fowler MRN: [id number] DOB: 12-05-1995  Today's TOC FU Call Status: Today's TOC FU Call Status:: Unsuccessful Call (2nd Attempt) Unsuccessful Call (1st Attempt) Date: 04/25/23 Unsuccessful Call (2nd Attempt) Date: 04/28/23  Attempted to reach the patient regarding the most recent Inpatient/ED visit.  Follow Up Plan: Additional outreach attempts will be made to reach the patient to complete the Transitions of Care (Post Inpatient/ED visit) call.   Signature: Wilhemena Durie, CMA

## 2023-04-29 ENCOUNTER — Ambulatory Visit: Payer: Self-pay

## 2023-04-29 NOTE — Transitions of Care (Post Inpatient/ED Visit) (Signed)
   04/29/2023  Name: Victor Fowler MRN: [id number] DOB: Sep 29, 1995  Today's TOC FU Call Status: Today's TOC FU Call Status:: Unsuccessful Call (3rd Attempt) Unsuccessful Call (1st Attempt) Date: 04/25/23 Unsuccessful Call (2nd Attempt) Date: 04/28/23 Unsuccessful Call (3rd Attempt) Date: 04/29/23  Attempted to reach the patient regarding the most recent Inpatient/ED visit.  Follow Up Plan: No further outreach attempts will be made at this time. We have been unable to contact the patient.  Signature: Wilhemena Durie, CMA

## 2023-04-29 NOTE — Telephone Encounter (Signed)
  Chief Complaint: neck pain and chest tightness Symptoms: pt c/o neck pain that occurs concurrently with chest tightness and SOB with exertion.neck apin occurs when bending head over,  Frequency: Neck pain has been present  Pertinent Negatives: Patient denies headache fever  Disposition: [] ED /[] Urgent Care (no appt availability in office) / [x] Appointment(In office/virtual)/ []  [facility]/ [] Home Care/ [] Refused Recommended Disposition /[]  Mobile Bus/ []  Follow-up with PCP Additional Notes: hospital f/u scheduled routing to office- pt was worked up in ED last night and for chest pain -  Reason for Disposition  Numbness in an arm or hand (i.e., loss of sensation)  Answer Assessment - Initial Assessment Questions 1. ONSET: "When did the pain begin?"      3 years  2. LOCATION: "Where does it hurt?"      Back of neck  3. PATTERN "Does the pain come and go, or has it been constant since it started?"      Comes and goes  4. SEVERITY: "How bad is the pain?"  (Scale 1-10; or mild, moderate, severe)   - NO PAIN (0): no pain or only slight stiffness    - MILD (1-3): doesn't interfere with normal activities    - MODERATE (4-7): interferes with normal activities or awakens from sleep    - SEVERE (8-10):  excruciating pain, unable to do any normal activities      severe 5. RADIATION: "Does the pain go anywhere else, shoot into your arms?"     Shoulder to left arm at times ahand gets weak and tingling 6. CORD SYMPTOMS: "Any weakness or numbness of the arms or legs?"     Hand weakness and tingling  7. CAUSE: "What do you think is causing the neck pain?"     Unknown  8. NECK OVERUSE: "Any recent activities that involved turning or twisting the neck?"     Hurts with bending head down worse after aaatv 9. OTHER SYMPTOMS: "Do you have any other symptoms?" (e.g., headache, fever, chest pain, difficulty breathing, neck swelling)     Shortness of breath, with exertion- feels tight  more fdificult than shpoulr be 10. PREGNANCY: "Is there any chance you are pregnant?" "When was your last menstrual period?"       N/a  Protocols used: Neck Pain or Stiffness-A-AH

## 2023-04-30 NOTE — Telephone Encounter (Signed)
Called and spoke to patient. States he is feeling about the same. Advised patient to go to the ER if the chest tightness or neck pain gets worse before coming in to see [place]. Patient verbalized understanding.

## 2023-05-02 ENCOUNTER — Inpatient Hospital Stay: Payer: Self-pay | Admitting: Physician Assistant

## 2023-05-07 ENCOUNTER — Ambulatory Visit (INDEPENDENT_AMBULATORY_CARE_PROVIDER_SITE_OTHER): Payer: Self-pay | Admitting: Physician Assistant

## 2023-05-07 VITALS — BP 135/82 | HR 68 | Temp 98.0°F | Ht 67.0 in | Wt 175.6 lb

## 2023-05-07 DIAGNOSIS — G8929 Other chronic pain: Secondary | ICD-10-CM

## 2023-05-07 DIAGNOSIS — F419 Anxiety disorder, unspecified: Secondary | ICD-10-CM

## 2023-05-07 DIAGNOSIS — M7918 Myalgia, other site: Secondary | ICD-10-CM

## 2023-05-07 DIAGNOSIS — M542 Cervicalgia: Secondary | ICD-10-CM

## 2023-05-07 MED ORDER — DULOXETINE HCL 20 MG PO CPEP
20.0000 mg | ORAL_CAPSULE | Freq: Every day | ORAL | 1 refills | Status: AC
Start: 2023-05-07 — End: ?

## 2023-05-07 MED ORDER — BUSPIRONE HCL 5 MG PO TABS
5.0000 mg | ORAL_TABLET | Freq: Two times a day (BID) | ORAL | 3 refills | Status: AC
Start: 2023-05-07 — End: ?

## 2023-05-07 NOTE — Progress Notes (Signed)
Acute Office Visit   Patient: Victor Fowler   DOB: [birth year]   27 y.o. Male  MRN: [id number] Visit Date: 05/07/2023  Today's healthcare provider: Oswaldo Conroy Shaunette Gassner, PA-C  Introduced myself to the patient as a Secondary school teacher and provided education on APPs in clinical practice.    Chief Complaint  Patient presents with   Follow-up    Patient was seen in ED on 04/24/23    Subjective    HPI HPI     Follow-up    Additional comments: Patient was seen in ED on 04/24/23       Last edited by Roshad Hack, Oswaldo Conroy, PA-C on 05/07/2023  9:28 AM.        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 and tingling Reports when he gets out of the car he feels drunk at times and off balance  States that standing seems to make it better  He reports that he sometimes has swelling sensation in his pectoral area   He thinks that the neck pain getting worse made his anxiety flare and this caused his SOB last week   He does have intermittent chest pain - states this has been ongoing for years- he thinks it is related to his job due to physical nature of his job   He reports some concerns for fibromyalgia- states that he has tingling in his fingers, facial twitches and muscle aches and is concerned that this may be the cause  He reports most of his body feels like he has body aches (like with the flu) all the time   Anxiety  He states this could be better Reports anxiety gets worse with increased pain States he felt like it was better on the Buspar  He has been out of his  Buspar as Dr. Patrecia Pace retired  Buspar is the only thing he has tried for his anxiety       05/07/2023    9:51 AM 08/05/2022    8:13 AM 06/17/2022    8:35 AM 02/26/2022    4:10 PM 07/17/2017   11:27 AM  Depression screen PHQ 2/9  Decreased Interest 0 0 0 0 0  Down, Depressed, Hopeless 0 0 0 0 0  PHQ - 2 Score 0 0 0 0 0  Altered sleeping 0 0 0 0 3  Tired, decreased energy 0 0 0 3 0  Change in appetite 0 0 0 0 0  Feeling bad or failure about yourself  0 0 0 0 0  Trouble concentrating 0 0 0 0 0  Moving slowly or fidgety/restless 0 0 0 0 0  Suicidal thoughts 0 0 0 0 0  PHQ-9 Score 0 0 0 3 3  Difficult doing work/chores  Not difficult at all Not difficult at all Not difficult at all Not difficult at all      05/07/2023    9:52 AM 08/05/2022    8:13 AM 06/17/2022  8:35 AM 02/26/2022    4:11 PM  GAD 7 : Generalized Anxiety Score  Nervous, Anxious, on Edge 0 0 0 0  Control/stop worrying 0 0 0 0  Worry too much - different things 0 0 0 0  Trouble relaxing 3 0 0 0  Restless 0 0 0 0  Easily annoyed or irritable 0 0 0 0  Afraid - awful might happen 0 0 0 0  Total GAD 7 Score 3 0 0 0  Anxiety Difficulty  Not difficult at all Not difficult at all Not difficult at all      Medications: Outpatient Medications Prior to Visit  Medication Sig   [DISCONTINUED] busPIRone (BUSPAR) 5 MG tablet Take 5 mg by mouth 2 (two) times daily. (Patient not taking: Reported on 05/07/2023)   No facility-administered medications prior to visit.    Review of Systems  Constitutional:  Negative for chills and fever.  Respiratory:  Negative for cough and shortness of breath.   Cardiovascular:  Positive for chest pain. Negative for palpitations and leg swelling.  Musculoskeletal:  Positive for myalgias and neck pain.  Neurological:  Positive for tremors, weakness (especially left hand) and numbness. Negative for syncope.        Objective    BP 135/82 (BP Location: Left Arm, Patient Position:  Sitting, Cuff Size: Normal)   Pulse 68   Temp 98 F (36.7 C)   Ht 5\' 7"  (1.702 m)   Wt 175 lb 9.6 oz (79.7 kg)   SpO2 98%   BMI 27.50 kg/m     Physical Exam Vitals reviewed.  Constitutional:      General: He is awake.     Appearance: Normal appearance. He is well-developed and well-groomed.  HENT:     Head: Normocephalic and atraumatic.  Cardiovascular:     Rate and Rhythm: Normal rate and regular rhythm.     Pulses:          Radial pulses are 2+ on the right side and 1+ on the left side.     Heart sounds: Normal heart sounds. No murmur heard.    No friction rub. No gallop.  Pulmonary:     Effort: Pulmonary effort is normal.  Musculoskeletal:     Cervical back: No swelling, edema, deformity, signs of trauma, spasms, torticollis, tenderness or bony tenderness. No pain with movement. Normal range of motion.     Right lower leg: No edema.     Left lower leg: No edema.  Neurological:     Mental Status: He is alert.     Comments: Comments: MENTAL STATUS: AAOx3, memory intact, fund of knowledge appropriate   LANG/SPEECH: Naming and repetition intact, fluent, no dysarthria, follows 3-step commands, answers questions appropriately     CRANIAL NERVES:   II: Pupils equal and reactive, no RAPD   III, IV, VI: EOM intact, no gaze preference or deviation, no nystagmus.   V: normal sensation in V1, V2, and V3 segments bilaterally   VII: no asymmetry, no nasolabial fold flattening   VIII: normal hearing to speech   IX, X: normal palatal elevation, no uvular deviation   XI: 5/5 head turn and 5/5 shoulder shrug bilaterally   XII: midline tongue protrusion   MOTOR:  5/5 grip strength right hand, 4/5 grip strength with left hand  COORD: no tremor, no dysmetria   STATION: normal stance,   GAIT: Normal   Psychiatric:        Behavior: Behavior is cooperative.  Results for orders placed or performed in visit on 05/07/23  Antinuclear Antib (ANA)  Result Value Ref Range    Anti Nuclear Antibody (ANA) Negative Negative  C-reactive protein  Result Value Ref Range   CRP <1 0 - 10 mg/L  Sed Rate (ESR)  Result Value Ref Range   Sed Rate 2 0 - 15 mm/hr  Comp Met (CMET)  Result Value Ref Range   Glucose 98 70 - 99 mg/dL   BUN 19 6 - 20 mg/dL   Creatinine, Ser 7.84 0.76 - 1.27 mg/dL   eGFR 696 >29 BM/WUX/3.24   BUN/Creatinine Ratio 20 9 - 20   Sodium 141 134 - 144 mmol/L   Potassium 4.6 3.5 - 5.2 mmol/L   Chloride 104 96 - 106 mmol/L   CO2 23 20 - 29 mmol/L   Calcium 10.0 8.7 - 10.2 mg/dL   Total Protein 7.5 6.0 - 8.5 g/dL   Albumin 4.9 4.3 - 5.2 g/dL   Globulin, Total 2.6 1.5 - 4.5 g/dL   Bilirubin Total 0.5 0.0 - 1.2 mg/dL   Alkaline Phosphatase 89 44 - 121 IU/L   AST 18 0 - 40 IU/L   ALT 27 0 - 44 IU/L  CBC w/Diff  Result Value Ref Range   WBC 5.5 3.4 - 10.8 x10E3/uL   RBC 5.17 4.14 - 5.80 x10E6/uL   Hemoglobin 16.3 13.0 - 17.7 g/dL   Hematocrit 40.1 02.7 - 51.0 %   MCV 92 79 - 97 fL   MCH 31.5 26.6 - 33.0 pg   MCHC 34.2 31.5 - 35.7 g/dL   RDW 25.3 66.4 - 40.3 %   Platelets 301 150 - 450 x10E3/uL   Neutrophils 70 Not Estab. %   Lymphs 19 Not Estab. %   Monocytes 8 Not Estab. %   Eos 2 Not Estab. %   Basos 1 Not Estab. %   Neutrophils Absolute 3.9 1.4 - 7.0 x10E3/uL   Lymphocytes Absolute 1.1 0.7 - 3.1 x10E3/uL   Monocytes Absolute 0.4 0.1 - 0.9 x10E3/uL   EOS (ABSOLUTE) 0.1 0.0 - 0.4 x10E3/uL   Basophils Absolute 0.1 0.0 - 0.2 x10E3/uL   Immature Granulocytes 0 Not Estab. %   Immature Grans (Abs) 0.0 0.0 - 0.1 x10E3/uL  HgB A1c  Result Value Ref Range   Hgb A1c MFr Bld 5.6 4.8 - 5.6 %   Est. average glucose Bld gHb Est-mCnc 114 mg/dL    Assessment & Plan      Return in about 4 weeks (around 06/04/2023) for anxiety- started cymbalta for pain and mood.      Problem List Items Addressed This Visit       Other   Neck pain, chronic - Primary    See Myalgia for pain management plan      Relevant Medications   DULoxetine  (CYMBALTA) 20 MG capsule   Other Relevant Orders   Antinuclear Antib (ANA) (Completed)   C-reactive protein (Completed)   Sed Rate (ESR) (Completed)   Comp Met (CMET) (Completed)   CBC w/Diff (Completed)   HgB A1c (Completed)   Ambulatory referral to Rheumatology   Myalgia, multiple sites    Chronic, ongoing concern He reports ongoing neck pain and body pain for several years that seems to be getting worse  He states that his neck pain seems to become worse with prolonged sitting and improves with standing and walking around He reports intermittent chest and pectoral pain- was evaluated in ED on 04/24/23 for his -  reviewed visit notes, EKG, labs and imaging results from this encounter Will check CBC, CMP, CRP, ESR, ANA today - results to dictate further management Will place referral to Rheumatology per patient request as he has concerns for fibromyalgia Differential at this time may include but is not limited to: degenerative disc disease, ankylosing spondylitis, fibromyalgia, thoracic outlet syndrome, somatic mood disorder, other autoimmune condition Will start Duloxetine 20 mg PO every day  to assist with pain for now Recommend follow up in about 4 weeks to discuss progress        Relevant Medications   DULoxetine (CYMBALTA) 20 MG capsule   Other Relevant Orders   Antinuclear Antib (ANA) (Completed)   C-reactive protein (Completed)   Sed Rate (ESR) (Completed)   Comp Met (CMET) (Completed)   CBC w/Diff (Completed)   HgB A1c (Completed)   Ambulatory referral to Rheumatology   Anxiety    Chronic, ongoing Patient was previously treated for this by another provider with Buspar 5 mg PO BID and has run out of medication He feels like this has caused an increase in his symptoms and would like to restart it  Will send in script for this and Duloxetine 20 mg PO every day for anxiety and myalgias/ chronic neck pains  Reviewed PHQ9 and GAD7 with him  Reviewed side effects of medication  and return precautions Recommend follow up in about 4-6 weeks to discuss response and assess medication efficacy       Relevant Medications   DULoxetine (CYMBALTA) 20 MG capsule   busPIRone (BUSPAR) 5 MG tablet     Return in about 4 weeks (around 06/04/2023) for anxiety- started cymbalta for pain and mood.   I, Bobbie Virden E Curstin Schmale, PA-C, have reviewed all documentation for this visit. The documentation on 05/09/23 for the exam, diagnosis, procedures, and orders are all accurate and complete.   Jacquelin Hawking, MHS, PA-C [facility]

## 2023-05-08 LAB — CBC WITH DIFFERENTIAL/PLATELET
Basophils Absolute: 0.1 10*3/uL (ref 0.0–0.2)
Basos: 1 %
EOS (ABSOLUTE): 0.1 10*3/uL (ref 0.0–0.4)
Eos: 2 %
Hematocrit: 47.6 % (ref 37.5–51.0)
Hemoglobin: 16.3 g/dL (ref 13.0–17.7)
Immature Grans (Abs): 0 10*3/uL (ref 0.0–0.1)
Immature Granulocytes: 0 %
Lymphocytes Absolute: 1.1 10*3/uL (ref 0.7–3.1)
Lymphs: 19 %
MCH: 31.5 pg (ref 26.6–33.0)
MCHC: 34.2 g/dL (ref 31.5–35.7)
MCV: 92 fL (ref 79–97)
Monocytes Absolute: 0.4 10*3/uL (ref 0.1–0.9)
Monocytes: 8 %
Neutrophils Absolute: 3.9 10*3/uL (ref 1.4–7.0)
Neutrophils: 70 %
Platelets: 301 10*3/uL (ref 150–450)
RBC: 5.17 x10E6/uL (ref 4.14–5.80)
RDW: 13 % (ref 11.6–15.4)
WBC: 5.5 10*3/uL (ref 3.4–10.8)

## 2023-05-08 LAB — COMPREHENSIVE METABOLIC PANEL
ALT: 27 IU/L (ref 0–44)
AST: 18 IU/L (ref 0–40)
Albumin: 4.9 g/dL (ref 4.3–5.2)
Alkaline Phosphatase: 89 IU/L (ref 44–121)
BUN/Creatinine Ratio: 20 (ref 9–20)
BUN: 19 mg/dL (ref 6–20)
Bilirubin Total: 0.5 mg/dL (ref 0.0–1.2)
CO2: 23 mmol/L (ref 20–29)
Calcium: 10 mg/dL (ref 8.7–10.2)
Chloride: 104 mmol/L (ref 96–106)
Creatinine, Ser: 0.95 mg/dL (ref 0.76–1.27)
Globulin, Total: 2.6 g/dL (ref 1.5–4.5)
Glucose: 98 mg/dL (ref 70–99)
Potassium: 4.6 mmol/L (ref 3.5–5.2)
Sodium: 141 mmol/L (ref 134–144)
Total Protein: 7.5 g/dL (ref 6.0–8.5)
eGFR: 112 mL/min/{1.73_m2} (ref >59)

## 2023-05-08 LAB — SEDIMENTATION RATE: Sed Rate: 2 mm/hr (ref 0–15)

## 2023-05-08 LAB — HEMOGLOBIN A1C
Est. average glucose Bld gHb Est-mCnc: 114 mg/dL
Hgb A1c MFr Bld: 5.6 % (ref 4.8–5.6)

## 2023-05-08 LAB — C-REACTIVE PROTEIN: CRP: <1 mg/L (ref 0–10)

## 2023-05-08 LAB — ANA: Anti Nuclear Antibody (ANA): NEGATIVE

## 2023-05-09 DIAGNOSIS — M7918 Myalgia, other site: Secondary | ICD-10-CM | POA: Insufficient documentation

## 2023-05-09 DIAGNOSIS — F419 Anxiety disorder, unspecified: Secondary | ICD-10-CM | POA: Insufficient documentation

## 2023-05-09 DIAGNOSIS — G8929 Other chronic pain: Secondary | ICD-10-CM | POA: Insufficient documentation

## 2023-05-09 NOTE — Assessment & Plan Note (Signed)
Chronic, ongoing Patient was previously treated for this by another provider with Buspar 5 mg PO BID and has run out of medication He feels like this has caused an increase in his symptoms and would like to restart it  Will send in script for this and Duloxetine 20 mg PO every day for anxiety and myalgias/ chronic neck pains  Reviewed PHQ9 and GAD7 with him  Reviewed side effects of medication and return precautions Recommend follow up in about 4-6 weeks to discuss response and assess medication efficacy

## 2023-05-09 NOTE — Assessment & Plan Note (Signed)
See Myalgia for pain management plan

## 2023-05-09 NOTE — Assessment & Plan Note (Addendum)
Chronic, ongoing concern He reports ongoing neck pain and body pain for several years that seems to be getting worse  He states that his neck pain seems to become worse with prolonged sitting and improves with standing and walking around He reports intermittent chest and pectoral pain- was evaluated in ED on 04/24/23 for his - reviewed visit notes, EKG, labs and imaging results from this encounter Will check CBC, CMP, CRP, ESR, ANA today - results to dictate further management Will place referral to Rheumatology per patient request as he has concerns for fibromyalgia Differential at this time may include but is not limited to: degenerative disc disease, ankylosing spondylitis, fibromyalgia, thoracic outlet syndrome, somatic mood disorder, other autoimmune condition Will start Duloxetine 20 mg PO every day  to assist with pain for now Recommend follow up in about 4 weeks to discuss progress

## 2023-05-09 NOTE — Progress Notes (Signed)
Labs are normal/stable at this time I recommend we proceed with trying to get you in to Rheumatology and start the Duloxetine for now.

## 2023-05-13 ENCOUNTER — Other Ambulatory Visit: Payer: Self-pay

## 2023-05-13 ENCOUNTER — Emergency Department: Payer: Self-pay

## 2023-05-13 ENCOUNTER — Emergency Department
Admission: EM | Admit: 2023-05-13 | Discharge: 2023-05-13 | Disposition: A | Discharge status: Home / Self Care | Payer: Self-pay | Attending: Emergency Medicine | Admitting: Emergency Medicine

## 2023-05-13 DIAGNOSIS — R109 Unspecified abdominal pain: Secondary | ICD-10-CM

## 2023-05-13 DIAGNOSIS — R55 Syncope and collapse: Secondary | ICD-10-CM

## 2023-05-13 LAB — COMPREHENSIVE METABOLIC PANEL
ALT: 28 U/L (ref 0–44)
AST: 19 U/L (ref 15–41)
Albumin: 4.4 g/dL (ref 3.5–5.0)
Alkaline Phosphatase: 63 U/L (ref 38–126)
Anion gap: 8 (ref 5–15)
BUN: 18 mg/dL (ref 6–20)
CO2: 24 mmol/L (ref 22–32)
Calcium: 9.4 mg/dL (ref 8.9–10.3)
Chloride: 104 mmol/L (ref 98–111)
Creatinine, Ser: 0.95 mg/dL (ref 0.61–1.24)
GFR, Estimated: >60 mL/min (ref >60)
Glucose, Bld: 106 mg/dL — ABNORMAL HIGH (ref 70–99)
Potassium: 3.8 mmol/L (ref 3.5–5.1)
Sodium: 136 mmol/L (ref 135–145)
Total Bilirubin: 0.9 mg/dL (ref 0.3–1.2)
Total Protein: 7.4 g/dL (ref 6.5–8.1)

## 2023-05-13 LAB — CBC WITH DIFFERENTIAL/PLATELET
Abs Immature Granulocytes: 0.02 10*3/uL (ref 0.00–0.07)
Basophils Absolute: 0 10*3/uL (ref 0.0–0.1)
Basophils Relative: 1 %
Eosinophils Absolute: 0.2 10*3/uL (ref 0.0–0.5)
Eosinophils Relative: 2 %
HCT: 46.5 % (ref 39.0–52.0)
Hemoglobin: 16.2 g/dL (ref 13.0–17.0)
Immature Granulocytes: 0 %
Lymphocytes Relative: 26 %
Lymphs Abs: 1.9 10*3/uL (ref 0.7–4.0)
MCH: 31.7 pg (ref 26.0–34.0)
MCHC: 34.8 g/dL (ref 30.0–36.0)
MCV: 91 fL (ref 80.0–100.0)
Monocytes Absolute: 0.5 10*3/uL (ref 0.1–1.0)
Monocytes Relative: 6 %
Neutro Abs: 4.9 10*3/uL (ref 1.7–7.7)
Neutrophils Relative %: 65 %
Platelets: 298 10*3/uL (ref 150–400)
RBC: 5.11 MIL/uL (ref 4.22–5.81)
RDW: 11.9 % (ref 11.5–15.5)
WBC: 7.5 10*3/uL (ref 4.0–10.5)
nRBC: 0 % (ref 0.0–0.2)

## 2023-05-13 LAB — URINALYSIS, ROUTINE W REFLEX MICROSCOPIC
Bilirubin Urine: NEGATIVE
Glucose, UA: NEGATIVE mg/dL
Hgb urine dipstick: NEGATIVE
Ketones, ur: NEGATIVE mg/dL
Leukocytes,Ua: NEGATIVE
Nitrite: NEGATIVE
Protein, ur: 30 mg/dL — AB
Specific Gravity, Urine: >1.03 — ABNORMAL HIGH (ref 1.005–1.030)
pH: 5.5 (ref 5.0–8.0)

## 2023-05-13 LAB — URINALYSIS, MICROSCOPIC (REFLEX): Bacteria, UA: NONE SEEN

## 2023-05-13 LAB — D-DIMER, QUANTITATIVE: D-Dimer, Quant: <0.27 ug{FEU}/mL (ref 0.00–0.50)

## 2023-05-13 LAB — LIPASE, BLOOD: Lipase: 26 U/L (ref 11–51)

## 2023-05-13 MED ORDER — KETOROLAC TROMETHAMINE 30 MG/ML IJ SOLN
15.0000 mg | Freq: Once | INTRAMUSCULAR | Status: AC
  Administered 2023-05-13: 15 mg via INTRAVENOUS
  Filled 2023-05-13: qty 1

## 2023-05-13 MED ORDER — MORPHINE SULFATE (PF) 4 MG/ML IV SOLN
4.0000 mg | Freq: Once | INTRAVENOUS | Status: AC
  Administered 2023-05-13: 4 mg via INTRAVENOUS
  Filled 2023-05-13: qty 1

## 2023-05-13 MED ORDER — ONDANSETRON HCL 4 MG/2ML IJ SOLN
4.0000 mg | INTRAMUSCULAR | Status: AC
  Administered 2023-05-13: 4 mg via INTRAVENOUS
  Filled 2023-05-13: qty 2

## 2023-05-13 NOTE — ED Provider Notes (Addendum)
[facility] Provider Note    Event Date/Time   First MD Initiated Contact with Patient 05/13/23 [phone]     (approximate)   History   Dizziness (Pt reports he was placed on Cymbalta 2 days ago for neck pain and lightheaded but since starting the medication he has been experiencing increased neck pain, dizziness. Tonight he woke up this morning d/t the right flank pain. Pt sts after waking up the room started "spinning" and vision began blurry. )   HPI Victor Fowler is a 27 y.o. male reports no chronic medical issues and presents for evaluation of accommodation of symptoms.  He states that he woke up during the night with severe sharp pain in his right flank radiating around to the right side of his abdomen.  He has never felt anything like that before.  He got up to go to the bathroom and on his way back he felt very clammy all of a sudden felt lightheaded like he was going to pass out and having trouble breathing and feeling very odd overall.  He felt like the room was spinning and his vision was blurry.  His girlfriend called 911 immediately.  He started to feel better before EMS got there and his symptoms have resolved completely except for the persistent right sided flank pain.  The only other change recently was that he was started on Cymbalta about a day ago reportedly for neck pain.  However he said that since he took a dose of the medicine he has not felt right.  He has had nausea but no vomiting.  He has had a decreased appetite.     Physical Exam   Triage Vital Signs: ED Triage Vitals  Encounter Vitals Group     BP 05/13/23 0436 137/78     Systolic BP Percentile --      Diastolic BP Percentile --      Pulse Rate 05/13/23 0436 69     Resp 05/13/23 0436 20     Temp 05/13/23 0436 98.2 F (36.8 C)     Temp src --      SpO2 05/13/23 0436 100 %     Weight 05/13/23 0437 79.4 kg (175 lb)     Height 05/13/23 0437 1.702 m (5\' 7" )     Head Circumference --       Peak Flow --      Pain Score --      Pain Loc --      Pain Education --      Exclude from Growth Chart --     Most recent vital signs: Vitals:   05/13/23 0630 05/13/23 0700  BP: 103/81 133/85  Pulse: 60 79  Resp: 15 18  Temp:    SpO2: 98% 97%    General: Awake, no distress.  Generally well-appearing. CV:  Good peripheral perfusion.  Regular rate and rhythm. Resp:  Normal effort. Speaking easily and comfortably, no accessory muscle usage nor intercostal retractions.   Abd:  No distention.  Mild but nonlocalized tenderness to palpation of the right side of his abdomen.  However he has tenderness to percussion of the right flank. Other:  No gross neurological deficits.  Awake, alert, oriented.  GCS 15.     ED Results / Procedures / Treatments   Labs (all labs ordered are listed, but only abnormal results are displayed) Labs Reviewed  COMPREHENSIVE METABOLIC PANEL - Abnormal; Notable for the following components:  Result Value   Glucose, Bld 106 (*)    All other components within normal limits  URINALYSIS, ROUTINE W REFLEX MICROSCOPIC - Abnormal; Notable for the following components:   Specific Gravity, Urine >1.030 (*)    Protein, ur 30 (*)    All other components within normal limits  CBC WITH DIFFERENTIAL/PLATELET  LIPASE, BLOOD  D-DIMER, QUANTITATIVE  URINALYSIS, MICROSCOPIC (REFLEX)     EKG  ED ECG REPORT I, Loleta Rose, the attending physician, personally viewed and interpreted this ECG.  Date: 05/13/2023 EKG Time: 5:13 AM Rate: 65 Rhythm: normal sinus rhythm QRS Axis: normal Intervals: normal ST/T Wave abnormalities: Non-specific ST segment / T-wave changes, but no clear evidence of acute ischemia. Narrative Interpretation: no definitive evidence of acute ischemia; does not meet STEMI criteria.    RADIOLOGY I viewed and interpreted the patient's CT abdomen/pelvis CT renal stone protocol.  I see no evidence of kidney stones or other acute  abnormality.  I also read the radiologist's report, which confirmed no acute findings.   PROCEDURES:  Critical Care performed: No  Procedures    IMPRESSION / MDM / ASSESSMENT AND PLAN / ED COURSE  I reviewed the triage vital signs and the nursing notes.                              Differential diagnosis includes, but is not limited to, renal/ureteral colic, UTI/pyelonephritis, musculoskeletal strain, PE, ACS, much less likely CVA.  Patient's presentation is most consistent with acute presentation with potential threat to life or bodily function.  Labs/studies ordered: CT renal stone study, CBC with differential, urinalysis, lipase, comprehensive metabolic panel  Interventions/Medications given:  Medications  ketorolac (TORADOL) 30 MG/ML injection 15 mg (15 mg Intravenous Given 05/13/23 0451)  morphine (PF) 4 MG/ML injection 4 mg (4 mg Intravenous Given 05/13/23 0451)  ondansetron (ZOFRAN) injection 4 mg (4 mg Intravenous Given 05/13/23 0450)    (Note:  hospital course my include additional interventions and/or labs/studies not listed above.)   Strongly suspect kidney stone causing ureteral colic given that the patient's pain in his right flank and radiating to the right side the abdomen woke him up during the night.  I think he had a vasovagal episode as result of the pain and getting up during the night to go to the bathroom.  His symptoms have resolved and he has no neurological deficits.  Evaluating with blood work and CT renal stone study.  Medications as listed above for his pain and nausea.  Patient knows to give [place] a urine specimen.  The patient is on the cardiac monitor to evaluate for evidence of arrhythmia and/or significant heart rate changes.   Clinical Course as of 05/13/23 0725  Tue May 13, 2023  0511 CT Renal Soundra Pilon I viewed and interpreted the patient's CT renal stone study and I see no evidence of kidney/ureteral stone.  I also read the radiologist's  report, which confirmed no acute findings. [CF]  0640 Unremarkable urinalysis, normal CMP and CBC with differential and lipase.  D-dimer pending. [CF]  2956 D-Dimer, Quant: <0.27 D-dimer is well within normal limits.  I reassessed the patient and he is clearly anxious.  I provided all the reassuring results and he obviously is not completely reassured.  He expressed concern because MyChart told him that his glucose was abnormal (it is 106) and he wonders if maybe he could be diabetic.  I provided reassurance again that there  is no evidence he has any acute or ongoing medical issues at this time and that he is safe to go home and follow-up with his primary care provider.  I explained to them about the glucose being normal.  He also reminded me that he started taking Cymbalta for neck pain and he said that he has felt worse after taking the medication, and ask if it would be okay if he stopped taking it.  I assured him that it would and encouraged him to do so and follow-up as an outpatient.  I gave my usual return precautions. [CF]  0725 Of note, I also suggested to him that anxiety or stress/panic attacks may be contributing to his symptoms and he said he would definitely consider it. [CF]    Clinical Course User Index [CF] Loleta Rose, MD     FINAL CLINICAL IMPRESSION(S) / ED DIAGNOSES   Final diagnoses:  Right flank pain  Near syncope     Rx / DC Orders   ED Discharge Orders     None        Note:  This document was prepared using Dragon voice recognition software and may include unintentional dictation errors.   Loleta Rose, MD 05/13/23 5621    Loleta Rose, MD 05/13/23 [id number]

## 2023-05-13 NOTE — ED Triage Notes (Signed)
Pt reports he was placed on Cymbalta 2 days ago for neck pain and lightheaded but since starting the medication he has been experiencing increased neck pain, dizziness. Tonight he woke up this morning d/t the right flank pain. Pt sts after waking up the room started "spinning" and vision began blurry.

## 2023-05-13 NOTE — Discharge Instructions (Addendum)
Your workup in the Emergency Department today was reassuring.  We did not find any specific abnormalities.  We recommend you drink plenty of fluids, take your regular medications and/or any new ones prescribed today, and follow up with the doctor(s) listed in these documents as recommended.  Return to the Emergency Department if you develop new or worsening symptoms that concern you.  

## 2023-05-15 ENCOUNTER — Telehealth: Payer: Self-pay

## 2023-05-15 NOTE — Transitions of Care (Post Inpatient/ED Visit) (Signed)
   05/15/2023  Name: Victor Fowler MRN: [id number] DOB: 1995/10/20  Today's TOC FU Call Status: Today's TOC FU Call Status:: Successful TOC FU Call Completed TOC FU Call Complete Date: 05/15/23 Patient's Name and Date of Birth confirmed.  Transition Care Management Follow-up Telephone Call Date of Discharge: 05/13/23 Discharge Facility: [facility]) Type of Discharge: Emergency Department Reason for ED Visit: Other: How have you been since you were released from the hospital?: Better  Items Reviewed: Did you receive and understand the discharge instructions provided?: No Medications obtained,verified, and reconciled?: Yes (Medications Reviewed) Any new allergies since your discharge?: No Dietary orders reviewed?: No Do you have support at home?: No  Medications Reviewed Today: Medications Reviewed Today     Reviewed by Pablo Ledger, CMA (Certified Medical Assistant) on 05/15/23 at [phone]  Med List Status: <None>   Medication Order Taking? Sig Documenting Provider Last Dose Status Informant  busPIRone (BUSPAR) 5 MG tablet [id number] Yes Take 1 tablet (5 mg total) by mouth 2 (two) times daily. Mecum, Erin E, PA-C Taking Active   DULoxetine (CYMBALTA) 20 MG capsule [id number] Yes Take 1 capsule (20 mg total) by mouth daily. Mecum, Oswaldo Conroy, PA-C Taking Active             Home Care and Equipment/Supplies: Were Home Health Services Ordered?: No Any new equipment or medical supplies ordered?: No  Functional Questionnaire: Do you need assistance with bathing/showering or dressing?: No Do you need assistance with meal preparation?: No Do you need assistance with eating?: No Do you have difficulty maintaining continence: No Do you need assistance with getting out of bed/getting out of a chair/moving?: No Do you have difficulty managing or taking your medications?: No  Follow up appointments reviewed: PCP Follow-up appointment confirmed?: No  (Patient declined to schedule at this time.) Specialist Hospital Follow-up appointment confirmed?: No Do you need transportation to your follow-up appointment?: No Do you understand care options if your condition(s) worsen?: Yes-patient verbalized understanding    SIGNATURE: Wilhemena Durie, CMA

## 2023-06-04 ENCOUNTER — Ambulatory Visit: Payer: Self-pay | Admitting: Nurse Practitioner

## 2023-06-04 ENCOUNTER — Encounter: Payer: Self-pay | Admitting: Nurse Practitioner

## 2024-06-03 ENCOUNTER — Other Ambulatory Visit: Payer: Self-pay

## 2024-06-03 ENCOUNTER — Emergency Department
Admission: EM | Admit: 2024-06-03 | Discharge: 2024-06-03 | Disposition: A | Discharge status: Home / Self Care | Payer: Self-pay | Attending: Emergency Medicine | Admitting: Emergency Medicine

## 2024-06-03 ENCOUNTER — Emergency Department: Payer: Self-pay

## 2024-06-03 ENCOUNTER — Encounter: Payer: Self-pay | Admitting: Emergency Medicine

## 2024-06-03 DIAGNOSIS — R0789 Other chest pain: Secondary | ICD-10-CM | POA: Insufficient documentation

## 2024-06-03 LAB — BASIC METABOLIC PANEL WITH GFR
Anion gap: 13 (ref 5–15)
BUN: 17 mg/dL (ref 6–20)
CO2: 23 mmol/L (ref 22–32)
Calcium: 9.4 mg/dL (ref 8.9–10.3)
Chloride: 104 mmol/L (ref 98–111)
Creatinine, Ser: 0.99 mg/dL (ref 0.61–1.24)
GFR, Estimated: >60 mL/min (ref >60)
Glucose, Bld: 112 mg/dL — ABNORMAL HIGH (ref 70–99)
Potassium: 4 mmol/L (ref 3.5–5.1)
Sodium: 140 mmol/L (ref 135–145)

## 2024-06-03 LAB — CBC
HCT: 45.8 % (ref 39.0–52.0)
Hemoglobin: 16 g/dL (ref 13.0–17.0)
MCH: 31.7 pg (ref 26.0–34.0)
MCHC: 34.9 g/dL (ref 30.0–36.0)
MCV: 90.7 fL (ref 80.0–100.0)
Platelets: 295 K/uL (ref 150–400)
RBC: 5.05 MIL/uL (ref 4.22–5.81)
RDW: 12.1 % (ref 11.5–15.5)
WBC: 8 K/uL (ref 4.0–10.5)
nRBC: 0 % (ref 0.0–0.2)

## 2024-06-03 LAB — TROPONIN I (HIGH SENSITIVITY): Troponin I (High Sensitivity): <2 ng/L (ref <18)

## 2024-06-03 NOTE — ED Provider Notes (Signed)
 [facility] Provider Note    Event Date/Time   First MD Initiated Contact with Patient 06/03/24 [phone]     (approximate)   History   Chest Pain   HPI  Victor Fowler is a 28 y.o. male who presents to the ED for evaluation of Chest Pain   Review of PCP visit from last year.  Seen for anxiety and multiple areas of MSK pain, started duloxetine .  Patient presents with left-sided chest pain that started overnight and has been persistent throughout the morning.  Pain is improved now and just wants to make sure he is okay.   Physical Exam   Triage Vital Signs: ED Triage Vitals  Encounter Vitals Group     BP 06/03/24 0825 (!) 140/88     Girls Systolic BP Percentile --      Girls Diastolic BP Percentile --      Boys Systolic BP Percentile --      Boys Diastolic BP Percentile --      Pulse Rate 06/03/24 0825 78     Resp 06/03/24 0825 17     Temp 06/03/24 0825 98.2 F (36.8 C)     Temp Source 06/03/24 0825 Oral     SpO2 06/03/24 0825 100 %     Weight 06/03/24 0826 180 lb (81.6 kg)     Height 06/03/24 0826 5' 7 (1.702 m)     Head Circumference --      Peak Flow --      Pain Score 06/03/24 0825 7     Pain Loc --      Pain Education --      Exclude from Growth Chart --     Most recent vital signs: Vitals:   06/03/24 0825  BP: (!) 140/88  Pulse: 78  Resp: 17  Temp: 98.2 F (36.8 C)  SpO2: 100%    General: Awake, no distress.  CV:  Good peripheral perfusion.  Resp:  Normal effort.  Abd:  No distention.  MSK:  No deformity noted.  Neuro:  No focal deficits appreciated. Other:     ED Results / Procedures / Treatments   Labs (all labs ordered are listed, but only abnormal results are displayed) Labs Reviewed  BASIC METABOLIC PANEL WITH GFR - Abnormal; Notable for the following components:      Result Value   Glucose, Bld 112 (*)    All other components within normal limits  CBC  TROPONIN I (HIGH SENSITIVITY)    EKG Sinus rhythm  with a rate of 70 bpm.  Normal axis and intervals without clear signs of acute ischemia.  Inverted T waves to lead III, biphasic T waves to V1.  Comparison from last year of a similar morphology.  RADIOLOGY CXR interpreted by me without evidence of acute cardiopulmonary pathology.  Official radiology report(s): DG Chest 2 View Result Date: 06/03/2024 EXAM: 2 VIEW(S) XRAY OF THE CHEST 06/03/2024 08:44:00 AM COMPARISON: 04/24/2023 CLINICAL HISTORY: cp. Patient to ED via POV for centralized chest pain that radiates to left side of chest. States it started during the night. Also reports heaviness in both legs and complaining of some SOB. FINDINGS: LUNGS AND PLEURA: No focal pulmonary opacity. No pulmonary edema. No pleural effusion. No pneumothorax. HEART AND MEDIASTINUM: No acute abnormality of the cardiac and mediastinal silhouettes. BONES AND SOFT TISSUES: No acute osseous abnormality. IMPRESSION: 1. Normal chest radiograph. No acute cardiopulmonary process. Electronically signed by: Waddell Calk MD 06/03/2024 08:55 AM EDT RP  Workstation: [id number]    PROCEDURES and INTERVENTIONS:  .1-3 Lead EKG Interpretation  Performed by: Claudene Rover, MD Authorized by: Claudene Rover, MD     Interpretation: normal     ECG rate:  82   ECG rate assessment: normal     Rhythm: sinus rhythm     Ectopy: none     Conduction: normal     Medications - No data to display   IMPRESSION / MDM / ASSESSMENT AND PLAN / ED COURSE  I reviewed the triage vital signs and the nursing notes.  Differential diagnosis includes, but is not limited to, ACS, PTX, PNA, muscle strain/spasm, PE, dissection, anxiety, pleural effusion  {Patient presents with symptoms of an acute illness or injury that is potentially life-threatening.  Low risk patient presents with atypical chest discomfort with a benign workup and suitable for outpatient management.  Nonischemic EKG, negative troponin, normal CBC and metabolic panel.  Clear  CXR.  No dysrhythmias on the monitor.  Doubt PE and he looks well to me with a normal exam, although mildly anxious.  Discussed close return precautions      FINAL CLINICAL IMPRESSION(S) / ED DIAGNOSES   Final diagnoses:  Other chest pain     Rx / DC Orders   ED Discharge Orders     None        Note:  This document was prepared using Dragon voice recognition software and may include unintentional dictation errors.   Claudene Rover, MD 06/03/24 [phone]

## 2024-06-03 NOTE — ED Triage Notes (Signed)
 Patient to ED via POV for centralized chest pain that radiates to left side of chest. States it started during the night. Also reports heaviness in both legs.

## 2024-09-23 NOTE — Addendum Note (Signed)
 Encounter addended by: I[place], Jessie L on: 09/23/2024 12:47 PM  Actions taken: Letter saved
